# Patient Record
Sex: Male | Born: 1990
Health system: Southern US, Community
[De-identification: ages and names within clinical notes are randomized; demographics above are authoritative.]

---
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
## 2010-11-01 ENCOUNTER — Encounter: Payer: Self-pay | Admitting: *Deleted

## 2010-11-01 ENCOUNTER — Emergency Department (HOSPITAL_COMMUNITY): Payer: No Typology Code available for payment source

## 2010-11-01 ENCOUNTER — Emergency Department (HOSPITAL_COMMUNITY)
Admission: EM | Admit: 2010-11-01 | Discharge: 2010-11-02 | Disposition: A | Discharge status: Home / Self Care | Payer: No Typology Code available for payment source | Attending: Emergency Medicine | Admitting: Emergency Medicine

## 2010-11-01 DIAGNOSIS — S20219A Contusion of unspecified front wall of thorax, initial encounter: Secondary | ICD-10-CM

## 2010-11-01 DIAGNOSIS — S239XXA Sprain of unspecified parts of thorax, initial encounter: Secondary | ICD-10-CM | POA: Insufficient documentation

## 2010-11-01 DIAGNOSIS — IMO0002 Reserved for concepts with insufficient information to code with codable children: Secondary | ICD-10-CM

## 2010-11-01 DIAGNOSIS — Y92410 Unspecified street and highway as the place of occurrence of the external cause: Secondary | ICD-10-CM | POA: Insufficient documentation

## 2010-11-01 MED ORDER — IBUPROFEN 800 MG PO TABS
800.0000 mg | ORAL_TABLET | Freq: Once | ORAL | Status: AC
  Administered 2010-11-01: 800 mg via ORAL
  Filled 2010-11-01: qty 1
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
## 2010-11-01 NOTE — ED Notes (Signed)
Spoke w/ pa about seeing pt to get him off the back board.
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
## 2010-11-02 MED ORDER — IBUPROFEN 800 MG PO TABS: 800.0000 mg | ORAL_TABLET | Freq: Once | ORAL | Status: DC
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
## 2010-12-05 NOTE — ED Provider Notes (Signed)
Medical screening examination/treatment/procedure(s) were performed by non-physician practitioner and as supervising physician I was immediately available for consultation/collaboration.  Nicoletta Dress. Colon Branch, MD 12/05/10 [phone]

## 2010-12-12 LAB — DIFFERENTIAL
Basophils Absolute: 0.1
Eosinophils Absolute: 0.2
Lymphocytes Relative: 31
Lymphs Abs: 2.1
Neutrophils Relative %: 54

## 2010-12-12 LAB — CBC
HCT: 41
Hemoglobin: 13.9
MCHC: 34
MCV: 83.1
RBC: 4.94

## 2010-12-12 LAB — COMPREHENSIVE METABOLIC PANEL
BUN: 14
CO2: 28
Calcium: 9.1
Creatinine, Ser: 0.91
Glucose, Bld: 103 — ABNORMAL HIGH
Total Bilirubin: 0.3

## 2010-12-12 LAB — URINALYSIS, ROUTINE W REFLEX MICROSCOPIC
Ketones, ur: NEGATIVE
Nitrite: NEGATIVE
Specific Gravity, Urine: 1.015
Urobilinogen, UA: 0.2
pH: 7

## 2010-12-12 LAB — LIPASE, BLOOD: Lipase: 15

## 2012-10-14 ENCOUNTER — Encounter (HOSPITAL_COMMUNITY): Payer: Self-pay | Admitting: Emergency Medicine

## 2012-10-14 ENCOUNTER — Emergency Department (HOSPITAL_COMMUNITY)
Admission: EM | Admit: 2012-10-14 | Discharge: 2012-10-14 | Disposition: A | Discharge status: Home / Self Care | Payer: BC Managed Care – PPO | Attending: Emergency Medicine | Admitting: Emergency Medicine

## 2012-10-14 DIAGNOSIS — M7989 Other specified soft tissue disorders: Secondary | ICD-10-CM | POA: Insufficient documentation

## 2012-10-14 DIAGNOSIS — R209 Unspecified disturbances of skin sensation: Secondary | ICD-10-CM | POA: Insufficient documentation

## 2012-10-14 DIAGNOSIS — Y929 Unspecified place or not applicable: Secondary | ICD-10-CM | POA: Insufficient documentation

## 2012-10-14 DIAGNOSIS — Y939 Activity, unspecified: Secondary | ICD-10-CM | POA: Insufficient documentation

## 2012-10-14 DIAGNOSIS — T6391XA Toxic effect of contact with unspecified venomous animal, accidental (unintentional), initial encounter: Secondary | ICD-10-CM | POA: Insufficient documentation

## 2012-10-14 DIAGNOSIS — T63461A Toxic effect of venom of wasps, accidental (unintentional), initial encounter: Secondary | ICD-10-CM | POA: Insufficient documentation

## 2012-10-14 MED ORDER — DIPHENHYDRAMINE HCL 25 MG PO CAPS
25.0000 mg | ORAL_CAPSULE | Freq: Once | ORAL | Status: AC
  Administered 2012-10-14: 25 mg via ORAL
  Filled 2012-10-14: qty 1

## 2012-10-14 MED ORDER — PREDNISONE 10 MG PO TABS: ORAL_TABLET | ORAL | Status: DC

## 2012-10-14 MED ORDER — PREDNISONE 50 MG PO TABS
60.0000 mg | ORAL_TABLET | Freq: Once | ORAL | Status: AC
  Administered 2012-10-14: 60 mg via ORAL
  Filled 2012-10-14: qty 1

## 2012-10-14 MED ORDER — FAMOTIDINE 20 MG PO TABS
20.0000 mg | ORAL_TABLET | Freq: Once | ORAL | Status: AC
  Administered 2012-10-14: 20 mg via ORAL
  Filled 2012-10-14: qty 1

## 2012-10-14 MED ORDER — FEXOFENADINE HCL 180 MG PO TABS: 180.0000 mg | ORAL_TABLET | Freq: Every day | ORAL | Status: DC

## 2012-10-14 NOTE — ED Notes (Signed)
Pt states he grabbed a railing and was either stung or biten on his L hand. Edema and redness noted to fingers and hand. NAD noted.

## 2012-10-14 NOTE — ED Notes (Signed)
BP retaken with larger cuff. 131/82.

## 2012-10-14 NOTE — ED Provider Notes (Signed)
CSN: [id number]     Arrival date & time 10/14/12  2127 History     First MD Initiated Contact with Patient 10/14/12 2138     Chief Complaint  Patient presents with  . Insect Bite   (Consider location/radiation/quality/duration/timing/severity/associated sxs/prior Treatment) HPI Comments: Patient is a 22 year old male who states that earlier this evening he grabbed a hole of a railing and noticed a stinging sensation of his left hand. Shortly after that he noted swelling about the apical areas extending back into the back of his hand. The patient denies any shortness of breath, wheezing, difficulty with breathing, or loss of consciousness. Patient states to his knowledge he does not have an allergy to be your walls stains. He is not sure if he was stung, or if he was bitten by something. Patient presents at this time for evaluation, as he states he has not had this type of reaction to a bite or steam in the past.  The history is provided by the patient.    History reviewed. No pertinent past medical history. History reviewed. No pertinent past surgical history. Family History  Problem Relation Age of Onset  . Asthma Mother   . Asthma Brother   . Heart failure Neg Hx    History  Substance Use Topics  . Smoking status: Never Smoker   . Smokeless tobacco: Never Used  . Alcohol Use: No    Review of Systems  Constitutional: Negative for activity change.       All ROS Neg except as noted in HPI  HENT: Negative for nosebleeds and neck pain.   Eyes: Negative for photophobia and discharge.  Respiratory: Negative for cough, shortness of breath and wheezing.   Cardiovascular: Negative for chest pain and palpitations.  Gastrointestinal: Negative for abdominal pain and blood in stool.  Genitourinary: Negative for dysuria, frequency and hematuria.  Musculoskeletal: Negative for back pain and arthralgias.  Skin: Negative.   Neurological: Negative for dizziness, seizures and speech  difficulty.  Psychiatric/Behavioral: Negative for hallucinations and confusion.    Allergies  Review of patient's allergies indicates no known allergies.  Home Medications   Current Outpatient Rx  Name  Route  Sig  Dispense  Refill  . fexofenadine (ALLEGRA) 180 MG tablet   Oral   Take 1 tablet (180 mg total) by mouth daily.   15 tablet   0   . predniSONE (DELTASONE) 10 MG tablet      5,4,3,2,1 - take with food   15 tablet   0    BP 131/82  Pulse 76  Temp(Src) 98.4 F (36.9 C) (Oral)  Ht 6\' 3"  (1.905 m)  Wt 230 lb (104.327 kg)  BMI 28.75 kg/m2  SpO2 99% Physical Exam  Nursing note and vitals reviewed. Constitutional: He is oriented to person, place, and time. He appears well-developed and well-nourished.  Non-toxic appearance.  HENT:  Head: Normocephalic.  Right Ear: Tympanic membrane and external ear normal.  Left Ear: Tympanic membrane and external ear normal.  No facial or oral swelling appreciated. Airway is patent. Speech is clear and understandable.  Eyes: EOM and lids are normal. Pupils are equal, round, and reactive to light.  Neck: Normal range of motion. Neck supple. Carotid bruit is not present.  Cardiovascular: Normal rate, regular rhythm, normal heart sounds, intact distal pulses and normal pulses.   Pulmonary/Chest: Breath sounds normal. No respiratory distress. He has no wheezes. He has no rales. He exhibits no tenderness.  Abdominal: Soft. Bowel sounds are normal.  There is no tenderness. There is no guarding.  Musculoskeletal: Normal range of motion.  Patient has mild-to-moderate swelling of the MP joints 2, 3 and 4 of the left hand. He has good range of motion of the fingers, but with some stiffness. There is full range of motion of the left wrist. Capillary refill is less than 2 seconds.  Lymphadenopathy:       Head (right side): No submandibular adenopathy present.       Head (left side): No submandibular adenopathy present.    He has no cervical  adenopathy.  Neurological: He is alert and oriented to person, place, and time. He has normal strength. No cranial nerve deficit or sensory deficit.  Skin: Skin is warm and dry.  Psychiatric: He has a normal mood and affect. His speech is normal.    ED Course   Procedures (including critical care time)  Labs Reviewed - No data to display No results found. 1. Insect sting, initial encounter     MDM  *I have reviewed nursing notes, vital signs, and all appropriate lab and imaging results for this patient.** Patient states he sustained a stain or a bite to his left hand earlier this evening. He noticed increasing swelling and redness and came to the emergency department because this is a different reaction and he has had in the past 2 bee stings or insect bites.  Patient was treated with prednisone, Benadryl, Pepcid, and ice. He was observed in the emergency department with no deterioration in his condition. The patient is noted to traverse with his friend in the room. He is ambulatory in the  [place] without problem. He speaks in complete sentences.  Patient advised to use ice to his hand tonight and tomorrow. He is to use Allegra 1 each morning, he can use Benadryl at bedtime if needed for itching or burning sensation. He is getting a tapering dose of prednisone. He is advised to return to the emergency department immediately if any emergent changes, problems, or concerns.  Kathie Dike, PA-C 10/14/12 2243

## 2012-10-15 NOTE — ED Provider Notes (Signed)
Medical screening examination/treatment/procedure(s) were performed by non-physician practitioner and as supervising physician I was immediately available for consultation/collaboration. Devoria Albe, MD, Armando Gang   Ward Givens, MD 10/15/12 [phone]

## 2013-11-19 ENCOUNTER — Encounter (HOSPITAL_COMMUNITY): Payer: Self-pay | Admitting: Emergency Medicine

## 2013-11-19 ENCOUNTER — Emergency Department (HOSPITAL_COMMUNITY): Payer: BC Managed Care – PPO

## 2013-11-19 ENCOUNTER — Emergency Department (HOSPITAL_COMMUNITY)
Admission: EM | Admit: 2013-11-19 | Discharge: 2013-11-19 | Disposition: A | Discharge status: Home / Self Care | Payer: BC Managed Care – PPO | Attending: Emergency Medicine | Admitting: Emergency Medicine

## 2013-11-19 DIAGNOSIS — S20211A Contusion of right front wall of thorax, initial encounter: Secondary | ICD-10-CM

## 2013-11-19 DIAGNOSIS — S39011A Strain of muscle, fascia and tendon of abdomen, initial encounter: Secondary | ICD-10-CM

## 2013-11-19 DIAGNOSIS — X58XXXA Exposure to other specified factors, initial encounter: Secondary | ICD-10-CM | POA: Insufficient documentation

## 2013-11-19 DIAGNOSIS — IMO0002 Reserved for concepts with insufficient information to code with codable children: Secondary | ICD-10-CM | POA: Insufficient documentation

## 2013-11-19 DIAGNOSIS — S298XXA Other specified injuries of thorax, initial encounter: Secondary | ICD-10-CM | POA: Diagnosis present

## 2013-11-19 DIAGNOSIS — Y93B9 Activity, other involving muscle strengthening exercises: Secondary | ICD-10-CM | POA: Diagnosis not present

## 2013-11-19 DIAGNOSIS — Y929 Unspecified place or not applicable: Secondary | ICD-10-CM | POA: Diagnosis not present

## 2013-11-19 DIAGNOSIS — S20219A Contusion of unspecified front wall of thorax, initial encounter: Secondary | ICD-10-CM | POA: Diagnosis not present

## 2013-11-19 MED ORDER — MELOXICAM 15 MG PO TABS
15.0000 mg | ORAL_TABLET | Freq: Every day | ORAL | Status: DC
Start: 2013-11-19 — End: 2019-01-12

## 2013-11-19 MED ORDER — METHOCARBAMOL 500 MG PO TABS: 500.0000 mg | ORAL_TABLET | Freq: Three times a day (TID) | ORAL | Status: DC

## 2013-11-19 MED ORDER — HYDROCODONE-ACETAMINOPHEN 5-325 MG PO TABS: 1.0000 | ORAL_TABLET | ORAL | Status: DC | PRN

## 2013-11-19 NOTE — ED Provider Notes (Signed)
Medical screening examination/treatment/procedure(s) were performed by non-physician practitioner and as supervising physician I was immediately available for consultation/collaboration.   EKG Interpretation None        Enid Skeens, MD 11/19/13 [phone]

## 2013-11-19 NOTE — ED Provider Notes (Signed)
CSN: [id number]     Arrival date & time 11/19/13  1308 History   First MD Initiated Contact with Patient 11/19/13 1406     No chief complaint on file.    (Consider location/radiation/quality/duration/timing/severity/associated sxs/prior Treatment) HPI Comments: Patient is a 23 year old male who presents to the emergency department with right lower rib area pain. The patient states that he was exercising earlier when he sustained an injury to the right lower ribs. He states after this injury he was noticing increasing pain with motion, as well as pain with deep breathing. He denies any hemoptysis. He denies unusual shortness of breath. The patient denies being on any anticoagulation medications and he has no history of bleeding disorders. His been no previous operations or procedures involving the lung area.  The history is provided by the patient.    History reviewed. No pertinent past medical history. History reviewed. No pertinent past surgical history. Family History  Problem Relation Age of Onset  . Asthma Mother   . Asthma Brother   . Heart failure Neg Hx    History  Substance Use Topics  . Smoking status: Never Smoker   . Smokeless tobacco: Never Used  . Alcohol Use: No    Review of Systems  Constitutional: Negative for activity change.       All ROS Neg except as noted in HPI  HENT: Negative for nosebleeds.   Eyes: Negative for photophobia and discharge.  Respiratory: Negative for cough, shortness of breath and wheezing.   Cardiovascular: Negative for chest pain and palpitations.  Gastrointestinal: Negative for abdominal pain and blood in stool.  Genitourinary: Negative for dysuria, frequency and hematuria.  Musculoskeletal: Negative for arthralgias, back pain and neck pain.  Skin: Negative.   Neurological: Negative for dizziness, seizures and speech difficulty.  Psychiatric/Behavioral: Negative for hallucinations and confusion.      Allergies  Review of patient's  allergies indicates no known allergies.  Home Medications   Prior to Admission medications   Not on File   BP 133/74  Pulse 64  Temp(Src) 99.1 F (37.3 C) (Oral)  Resp 18  Ht  (1.905 m)  Wt 247 lb (112.038 kg)  BMI 30.87 kg/m2  SpO2 99% Physical Exam  Nursing note and vitals reviewed. Constitutional: He is oriented to person, place, and time. He appears well-developed and well-nourished.  Non-toxic appearance.  HENT:  Head: Normocephalic.  Right Ear: Tympanic membrane and external ear normal.  Left Ear: Tympanic membrane and external ear normal.  Eyes: EOM and lids are normal. Pupils are equal, round, and reactive to light.  Neck: Normal range of motion. Neck supple. Carotid bruit is not present.  Cardiovascular: Normal rate, regular rhythm, normal heart sounds, intact distal pulses and normal pulses.   Pulmonary/Chest: Breath sounds normal. No respiratory distress. He exhibits tenderness and bony tenderness. He exhibits no crepitus.    Abdominal: Soft. Bowel sounds are normal. There is no tenderness. There is no guarding.  Musculoskeletal: Normal range of motion.  Lymphadenopathy:       Head (right side): No submandibular adenopathy present.       Head (left side): No submandibular adenopathy present.    He has no cervical adenopathy.  Neurological: He is alert and oriented to person, place, and time. He has normal strength. No cranial nerve deficit or sensory deficit.  Skin: Skin is warm and dry.  Psychiatric: He has a normal mood and affect. His speech is normal.    ED Course  Procedures (including  critical care time) Labs Review Labs Reviewed - No data to display  Imaging Review Dg Ribs Unilateral W/chest Right  11/19/2013   CLINICAL DATA:  Right low anterior rib pain exacerbated by motion and inspiration.  EXAM: RIGHT RIBS AND CHEST - 3+ VIEW  COMPARISON:  Prior chest x-ray 11/01/2010  FINDINGS: No fracture or other bone lesions are seen involving the ribs.  There is no evidence of pneumothorax or pleural effusion. Both lungs are clear. Heart size and mediastinal contours are within normal limits.  IMPRESSION: Negative.   Electronically Signed   By: Malachy Moan M.D.   On: 11/19/2013 14:20     EKG Interpretation None      MDM  Patient speaks in complete sentences without problem. Pulse oximetry is within normal limits. Chest x-ray is negative for any cardiopulmonary related problem. X-ray of the ribs negative for fracture or dislocation.  Patient treated with incentive spirometer, prescription given for Mobic, Robaxin, and Norco. The patient is advised to return to the emergency department if any changes, problems, or concerns.    Final diagnoses:  None    **I have reviewed nursing notes, vital signs, and all appropriate lab and imaging results for this patient.Kathie Dike, PA-C 11/19/13 1451

## 2013-11-19 NOTE — Discharge Instructions (Signed)
Your oxygen level is within normal limits. Your chest x-ray is negative for any acute problem. Your rib x-rays are negative for fracture or dislocation. Please use the incentive spirometer every 2 hours. Please use Mobic daily with food. Use Robaxin 3 times daily for spasm pain. Use Norco for pain if needed. Norco and Robaxin may cause drowsiness, please use with caution. Muscle Strain A muscle strain is an injury that occurs when a muscle is stretched beyond its normal length. Usually a small number of muscle fibers are torn when this happens. Muscle strain is rated in degrees. First-degree strains have the least amount of muscle fiber tearing and pain. Second-degree and third-degree strains have increasingly more tearing and pain.  Usually, recovery from muscle strain takes 1-2 weeks. Complete healing takes 5-6 weeks.  CAUSES  Muscle strain happens when a sudden, violent force placed on a muscle stretches it too far. This may occur with lifting, sports, or a fall.  RISK FACTORS Muscle strain is especially common in athletes.  SIGNS AND SYMPTOMS At the site of the muscle strain, there may be:  Pain.  Bruising.  Swelling.  Difficulty using the muscle due to pain or lack of normal function. DIAGNOSIS  Your health care provider will perform a physical exam and ask about your medical history. TREATMENT  Often, the best treatment for a muscle strain is resting, icing, and applying cold compresses to the injured area.  HOME CARE INSTRUCTIONS   Use the PRICE method of treatment to promote muscle healing during the first 2-3 days after your injury. The PRICE method involves:  Protecting the muscle from being injured again.  Restricting your activity and resting the injured body part.  Icing your injury. To do this, put ice in a plastic bag. Place a towel between your skin and the bag. Then, apply the ice and leave it on from 15-20 minutes each hour. After the third day, switch to moist heat  packs.  Apply compression to the injured area with a splint or elastic bandage. Be careful not to wrap it too tightly. This may interfere with blood circulation or increase swelling.  Elevate the injured body part above the level of your heart as often as you can.  Only take over-the-counter or prescription medicines for pain, discomfort, or fever as directed by your health care provider.  Warming up prior to exercise helps to prevent future muscle strains. SEEK MEDICAL CARE IF:   You have increasing pain or swelling in the injured area.  You have numbness, tingling, or a significant loss of strength in the injured area. MAKE SURE YOU:   Understand these instructions.  Will watch your condition.  Will get help right away if you are not doing well or get worse. Document Released: 02/19/2005 Document Revised: 12/10/2012 Document Reviewed: 09/18/2012 [facility] Patient Information 2015 [place], [place]. This information is not intended to replace advice given to you by your health care provider. Make sure you discuss any questions you have with your health care provider.  Contusion A contusion is a deep bruise. Contusions happen when an injury causes bleeding under the skin. Signs of bruising include pain, puffiness (swelling), and discolored skin. The contusion may turn blue, purple, or yellow. HOME CARE   Put ice on the injured area.  Put ice in a plastic bag.  Place a towel between your skin and the bag.  Leave the ice on for 15-20 minutes, 03-04 times a day.  Only take medicine as told by your  doctor.  Rest the injured area.  If possible, raise (elevate) the injured area to lessen puffiness. GET HELP RIGHT AWAY IF:   You have more bruising or puffiness.  You have pain that is getting worse.  Your puffiness or pain is not helped by medicine. MAKE SURE YOU:   Understand these instructions.  Will watch your condition.  Will get help right away if you are not doing  well or get worse. Document Released: 08/08/2007 Document Revised: 05/14/2011 Document Reviewed: 12/25/2010 [facility] Patient Information 2015 [place], [place]. This information is not intended to replace advice given to you by your health care provider. Make sure you discuss any questions you have with your health care provider. And a

## 2013-11-19 NOTE — ED Notes (Signed)
Pt was exercising and had sudden onset of pain rt lower ant ribs, Increased pain with motion and deep breath.

## 2016-03-05 IMAGING — CR DG RIBS W/ CHEST 3+V*R*
5 series · 5 of 5 positions shown · non-contrast
Comparison: Prior chest x-ray 11/01/2010

CLINICAL DATA: Right low anterior rib pain exacerbated by motion
and inspiration.

EXAM:
RIGHT RIBS AND CHEST - 3+ VIEW

[view not recorded (1 of 5)]
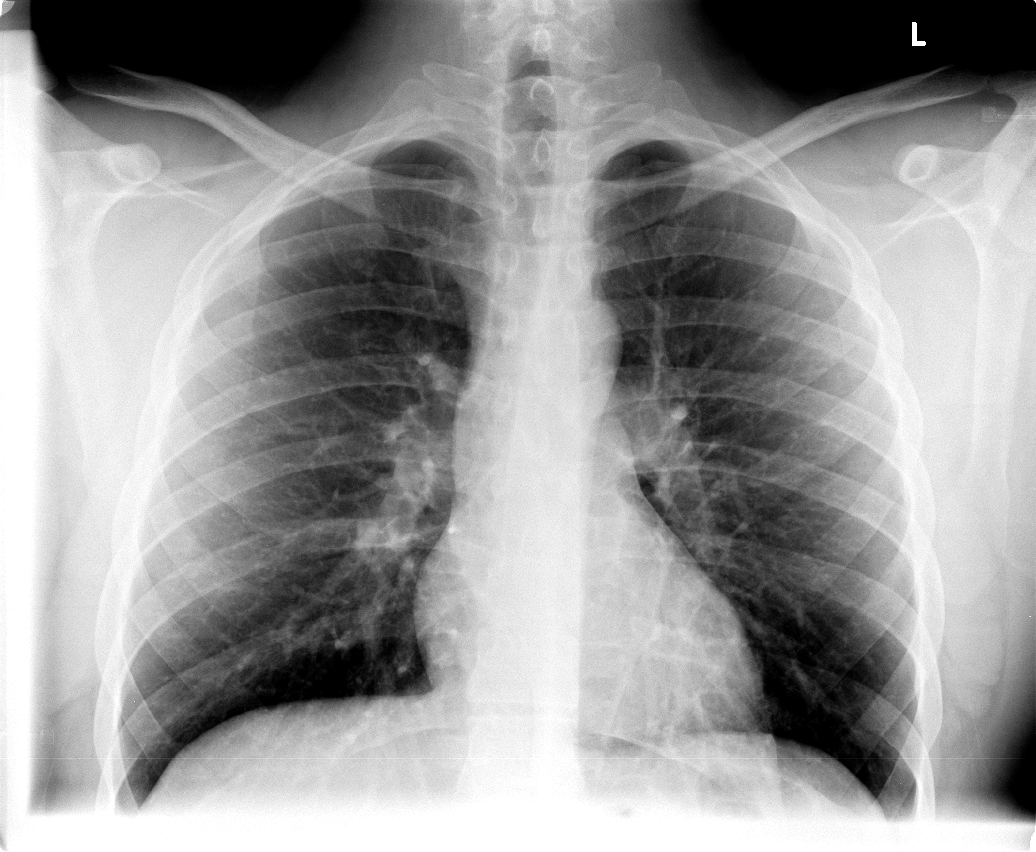

[view not recorded (2 of 5)]
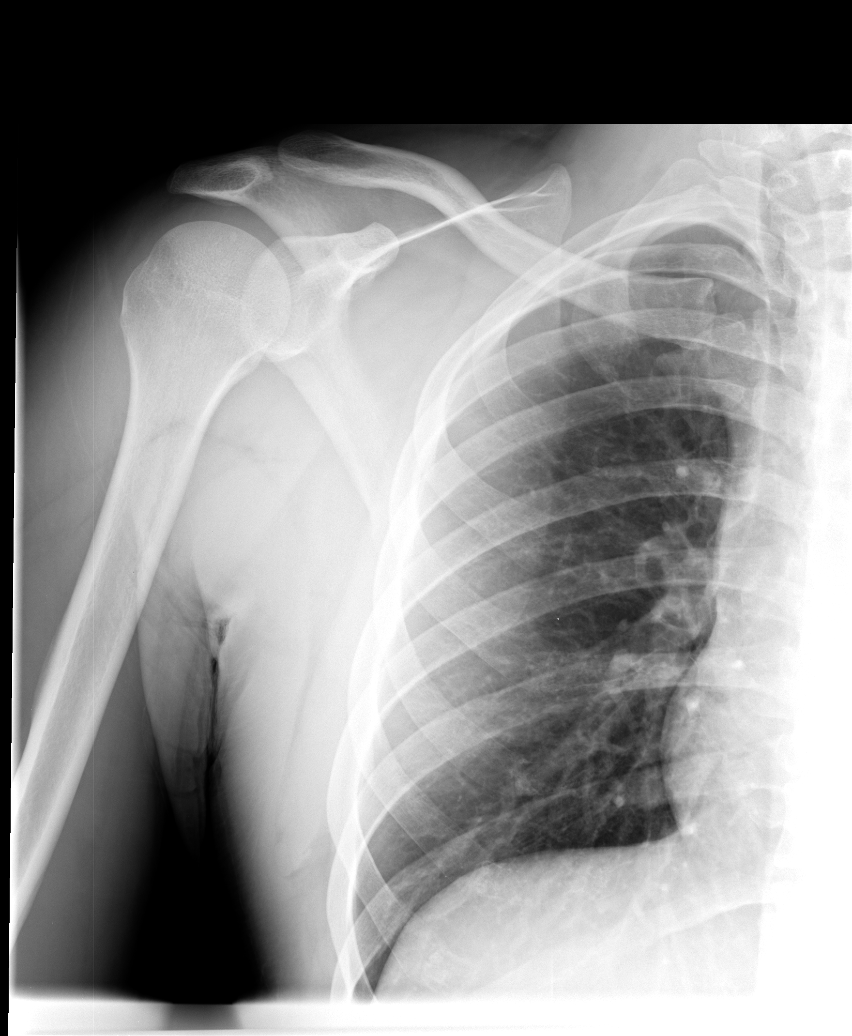

[view not recorded (3 of 5)]
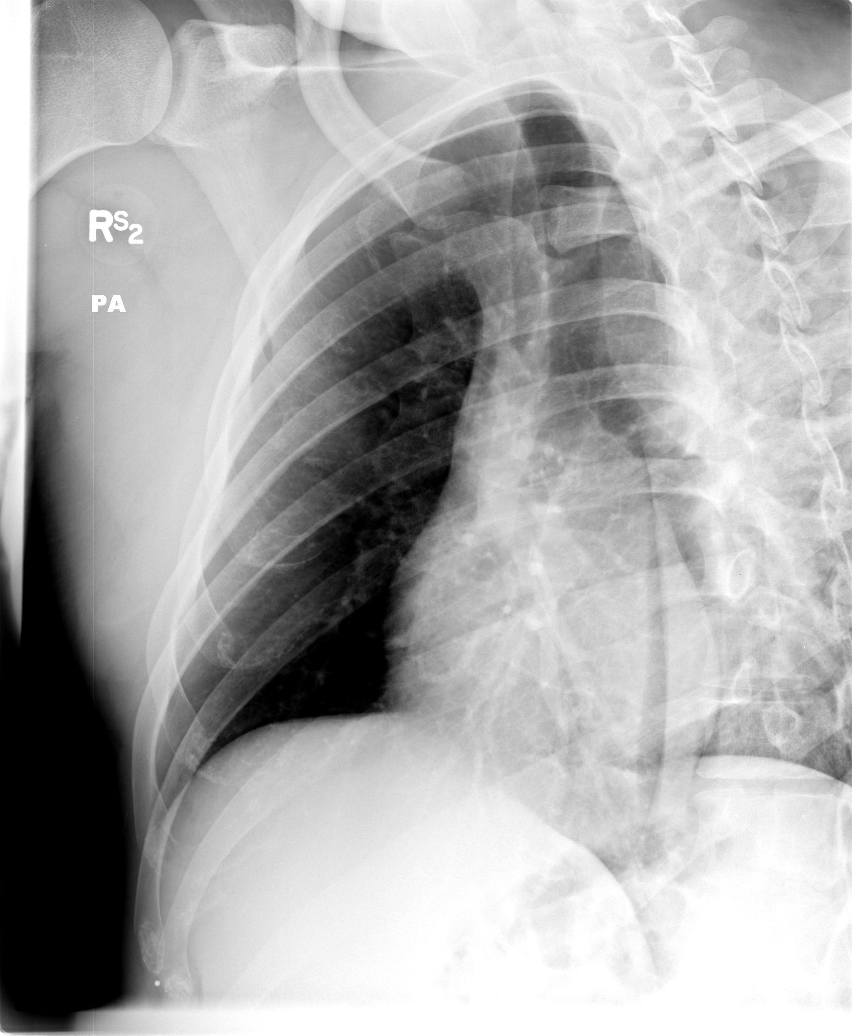

[view not recorded (4 of 5)]
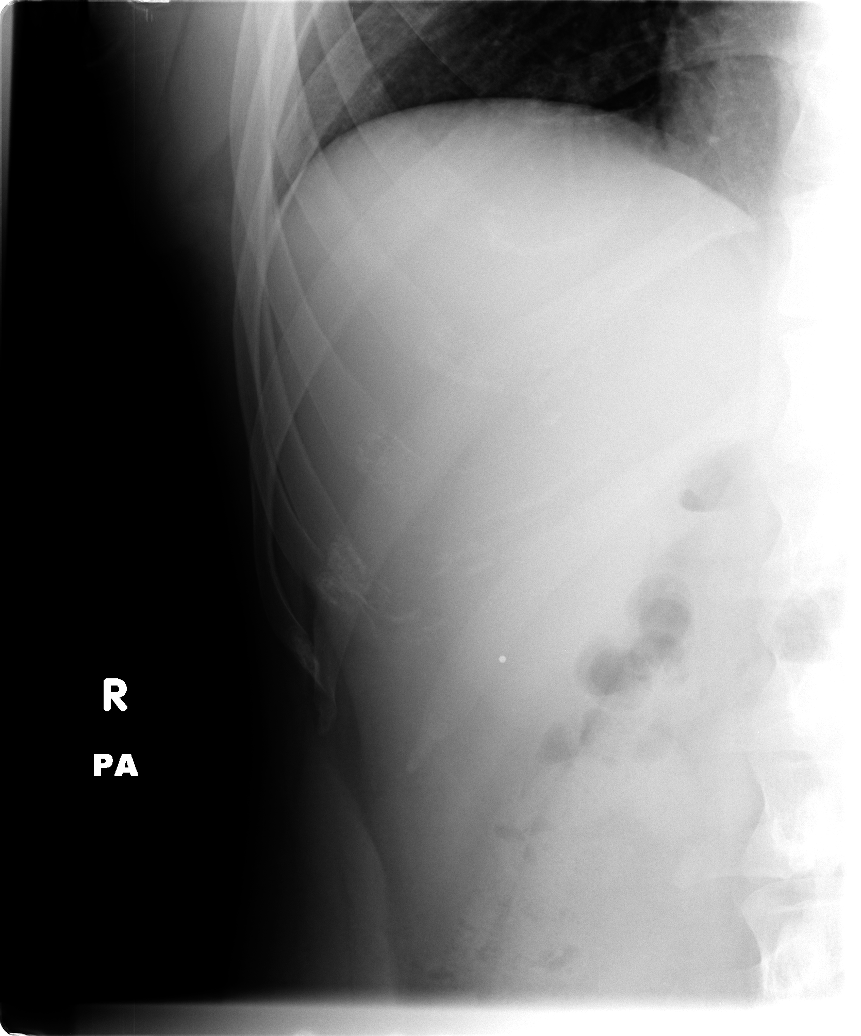

[view not recorded (5 of 5)]
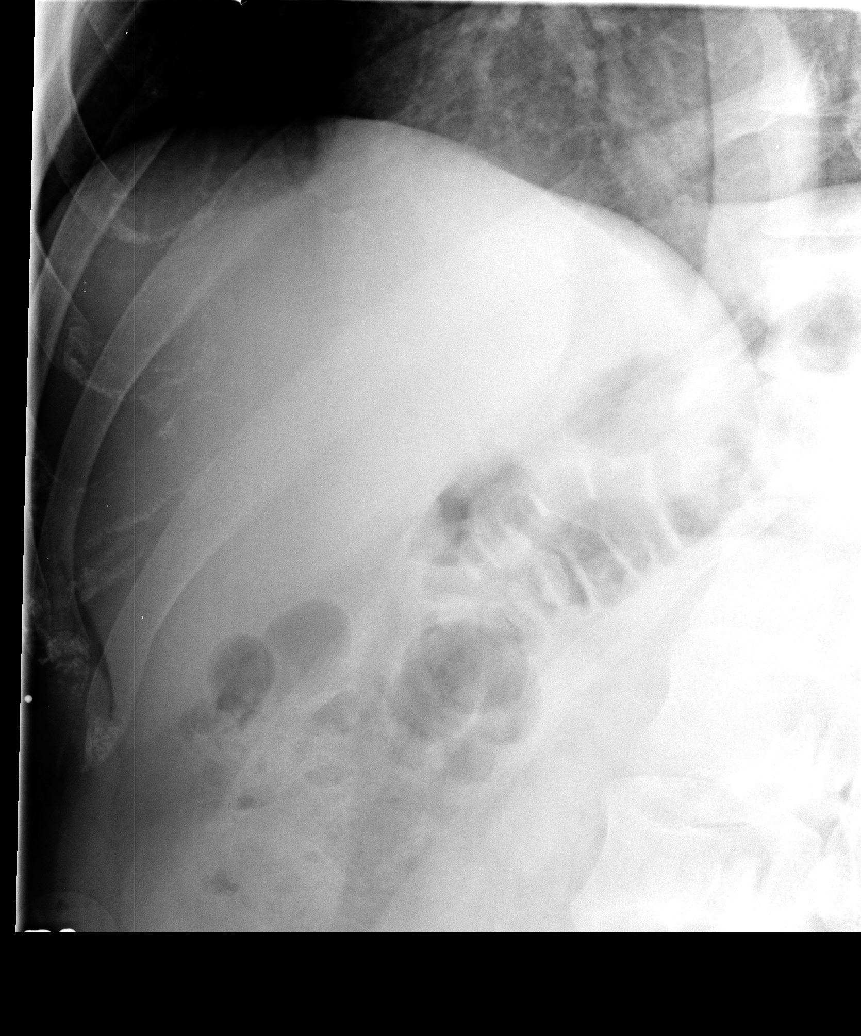

[5 of 5 positions shown; findings below may reference images not displayed]

FINDINGS: No fracture or other bone lesions are seen involving the ribs. There
is no evidence of pneumothorax or pleural effusion. Both lungs are
clear. Heart size and mediastinal contours are within normal limits.
IMPRESSION: Negative.

## 2018-04-15 DIAGNOSIS — Z13220 Encounter for screening for lipoid disorders: Secondary | ICD-10-CM | POA: Diagnosis not present

## 2018-04-15 DIAGNOSIS — E559 Vitamin D deficiency, unspecified: Secondary | ICD-10-CM | POA: Diagnosis not present

## 2018-04-15 DIAGNOSIS — R5383 Other fatigue: Secondary | ICD-10-CM | POA: Diagnosis not present

## 2018-04-15 DIAGNOSIS — Z131 Encounter for screening for diabetes mellitus: Secondary | ICD-10-CM | POA: Diagnosis not present

## 2018-04-15 DIAGNOSIS — Z0000 Encounter for general adult medical examination without abnormal findings: Secondary | ICD-10-CM | POA: Diagnosis not present

## 2018-04-29 DIAGNOSIS — E669 Obesity, unspecified: Secondary | ICD-10-CM | POA: Diagnosis not present

## 2018-04-29 DIAGNOSIS — R030 Elevated blood-pressure reading, without diagnosis of hypertension: Secondary | ICD-10-CM | POA: Diagnosis not present

## 2018-04-29 DIAGNOSIS — E559 Vitamin D deficiency, unspecified: Secondary | ICD-10-CM | POA: Diagnosis not present

## 2018-12-08 ENCOUNTER — Other Ambulatory Visit (INDEPENDENT_AMBULATORY_CARE_PROVIDER_SITE_OTHER): Payer: Self-pay

## 2018-12-08 ENCOUNTER — Encounter (INDEPENDENT_AMBULATORY_CARE_PROVIDER_SITE_OTHER): Payer: Self-pay | Admitting: Internal Medicine

## 2018-12-08 ENCOUNTER — Other Ambulatory Visit (INDEPENDENT_AMBULATORY_CARE_PROVIDER_SITE_OTHER): Payer: Self-pay | Admitting: Internal Medicine

## 2018-12-08 ENCOUNTER — Telehealth (INDEPENDENT_AMBULATORY_CARE_PROVIDER_SITE_OTHER): Payer: Self-pay

## 2018-12-08 DIAGNOSIS — Z03818 Encounter for observation for suspected exposure to other biological agents ruled out: Secondary | ICD-10-CM

## 2018-12-08 DIAGNOSIS — Z20822 Contact with and (suspected) exposure to covid-19: Secondary | ICD-10-CM

## 2018-12-08 NOTE — Telephone Encounter (Signed)
Let patient know that I have the note ready but he also needs to make a follow-up appointment to be seen in 2 to 3 weeks time as we have not seen him for a long time.

## 2018-12-08 NOTE — Progress Notes (Unsigned)
Pt will need a order to be tested today for covid in [place].

## 2018-12-08 NOTE — Progress Notes (Signed)
Already sent an order

## 2018-12-08 NOTE — Telephone Encounter (Signed)
Appt schedule on 2nd week of Nov.

## 2018-12-09 ENCOUNTER — Other Ambulatory Visit: Payer: Self-pay | Admitting: *Deleted

## 2018-12-09 DIAGNOSIS — Z20822 Contact with and (suspected) exposure to covid-19: Secondary | ICD-10-CM

## 2018-12-09 DIAGNOSIS — Z20828 Contact with and (suspected) exposure to other viral communicable diseases: Secondary | ICD-10-CM | POA: Diagnosis not present

## 2018-12-11 ENCOUNTER — Telehealth (INDEPENDENT_AMBULATORY_CARE_PROVIDER_SITE_OTHER): Payer: Self-pay | Admitting: Internal Medicine

## 2018-12-11 NOTE — Telephone Encounter (Signed)
Let the patient know that the testing center should call him before I would receive the results in most cases.

## 2018-12-11 NOTE — Telephone Encounter (Signed)
Pt wanted to know who will contact for  COVID test?

## 2018-12-12 LAB — NOVEL CORONAVIRUS, NAA: SARS-CoV-2, NAA: NOT DETECTED

## 2019-01-12 ENCOUNTER — Ambulatory Visit (INDEPENDENT_AMBULATORY_CARE_PROVIDER_SITE_OTHER): Payer: BC Managed Care – PPO | Admitting: Internal Medicine

## 2019-01-12 ENCOUNTER — Other Ambulatory Visit: Payer: Self-pay

## 2019-01-12 ENCOUNTER — Encounter (INDEPENDENT_AMBULATORY_CARE_PROVIDER_SITE_OTHER): Payer: Self-pay | Admitting: Internal Medicine

## 2019-01-12 VITALS — BP 130/80 | HR 88 | Temp 98.6°F | Resp 18 | Ht 74.0 in | Wt 254.0 lb

## 2019-01-12 DIAGNOSIS — E559 Vitamin D deficiency, unspecified: Secondary | ICD-10-CM | POA: Diagnosis not present

## 2019-01-12 DIAGNOSIS — E669 Obesity, unspecified: Secondary | ICD-10-CM | POA: Diagnosis not present

## 2019-01-12 DIAGNOSIS — E66811 Obesity, class 1: Secondary | ICD-10-CM

## 2019-01-12 DIAGNOSIS — Z8639 Personal history of other endocrine, nutritional and metabolic disease: Secondary | ICD-10-CM | POA: Insufficient documentation

## 2019-01-12 HISTORY — DX: Vitamin D deficiency, unspecified: E55.9

## 2019-01-12 NOTE — Progress Notes (Signed)
Metrics: Intervention Frequency ACO  Documented Smoking Status Yearly  Screened one or more times in 24 months  Cessation Counseling or  Active cessation medication Past 24 months  Past 24 months   Guideline developer: UpToDate (See UpToDate for funding source) Date Released: 2014       Wellness Office Visit  Subjective:  Patient ID: Zachary Strong, male    DOB: 1990-06-21  Age: 28 y.o. MRN: [id number]  CC: This man comes in for follow-up of vitamin D deficiency and obesity. HPI  When he was seen the last time which was more than 8 months ago, he was recommended to take vitamin D3 10,000 units daily and was given advice on nutrition.  Unfortunately, he has not done either of these things so far. Past Medical History:  Diagnosis Date  . Vitamin D deficiency disease 01/12/2019      Family History  Problem Relation Age of Onset  . Asthma Mother   . Asthma Brother   . Heart failure Neg Hx     Social History   Social History Narrative   Engaged,getting married in December..Lives with fiancee.Works for [employer].   Social History   Tobacco Use  . Smoking status: Never Smoker  . Smokeless tobacco: Never Used  Substance Use Topics  . Alcohol use: No    No outpatient medications have been marked as taking for the 01/12/19 encounter (Office Visit) with Doree Albee, MD.     Objective:   Today's Vitals: BP 130/80 (BP Location: Left Arm, Patient Position: Sitting, Cuff Size: Normal)   Pulse 88   Temp 98.6 F (37 C)   Resp 18   Ht 6\' 2"  (1.88 m)   Wt 254 lb (115.2 kg)   SpO2 98%   BMI 32.61 kg/m  Vitals with BMI 01/12/2019 11/19/2013 10/14/2012  Height 6\' 2"  6\' 3"  -  Weight 254 lbs 247 lbs -  BMI 40.9 81.1 -  Systolic 914 782 956  Diastolic 80 74 82  Pulse 88 64 -     Physical Exam       Assessment   1. Obesity (BMI 30.0-34.9)   2. Vitamin D deficiency disease       Tests ordered No orders of the defined types were placed  in this encounter.    Plan: 1. I discussed with him the concept of intermittent fasting and eating healthier again and hopefully he will try to do this.  I have given him a diet sheet also. 2. I strongly encouraged him to take vitamin D3 10,000 units daily. 3. I will see him in February of next year for an annual physical exam when we will do all the blood work.   No orders of the defined types were placed in this encounter.   Doree Albee, MD

## 2019-01-12 NOTE — Patient Instructions (Signed)
[facility] Dietary Recommendations for Weight Loss What to Avoid . Avoid added sugars o Often added sugar can be found in processed foods such as many condiments, dry cereals, cakes, cookies, chips, crisps, crackers, candies, sweetened drinks, etc.  o Read labels and AVOID/DECREASE use of foods with the following in their ingredient list: Sugar, fructose, high fructose corn syrup, sucrose, glucose, maltose, dextrose, molasses, cane sugar, brown sugar, any type of syrup, agave nectar, etc.   . Avoid snacking in between meals . Avoid foods made with flour o If you are going to eat food made with flour, choose those made with whole-grains; and, minimize your consumption as much as is tolerable . Avoid processed foods o These foods are generally stocked in the middle of the grocery store. Focus on shopping on the perimeter of the grocery.  What to Include . Vegetables o GREEN LEAFY VEGETABLES: Kale, spinach, mustard greens, collard greens, cabbage, broccoli, etc. o OTHER: Asparagus, cauliflower, eggplant, carrots, peas, Brussel sprouts, tomatoes, bell peppers, zucchini, beets, cucumbers, etc. . Grains, seeds, and legumes o Beans: kidney beans, black eyed peas, garbanzo beans, black beans, pinto beans, etc. o Whole, unrefined grains: brown rice, barley, bulgur, oatmeal, etc. . Healthy fats  o Avoid highly processed fats such as vegetable oil o Examples of healthy fats: avocado, olives, virgin olive oil, dark chocolate (?72% Cocoa), nuts (peanuts, almonds, walnuts, cashews, pecans, etc.) . Low - Moderate Intake of Animal Sources of Protein o Meat sources: chicken, turkey, salmon, tuna. Limit to 4 ounces of meat at one time. o Consider limiting dairy sources, but when choosing dairy focus on: PLAIN Greek yogurt, cottage cheese, high-protein milk . Fruit o Choose berries  When to Eat . Intermittent Fasting: o Choosing not to eat for a specific time period, but DO FOCUS ON HYDRATION  when fasting o Multiple Techniques: - Time Restricted Eating: eat 3 meals in a day, each meal lasting no more than 60 minutes, no snacks between meals - 16-18 hour fast: fast for 16 to 18 hours up to 7 days a week. Often suggested to start with 2-3 nonconsecutive days per week.  . Remember the time you sleep is counted as fasting.  . Examples of eating schedule: Fast from 7:00pm-11:00am. Eat between 11:00am-7:00pm.  - 24-hour fast: fast for 24 hours up to every other day. Often suggested to start with 1 day per week . Remember the time you sleep is counted as fasting . Examples of eating schedule:  o Eating day: eat 2-3 meals on your eating day. If doing 2 meals, each meal should last no more than 90 minutes. If doing 3 meals, each meal should last no more than 60 minutes. Finish last meal by 7:00pm. o Fasting day: Fast until 7:00pm.  o IF YOU FEEL UNWELL FOR ANY REASON/IN ANY WAY WHEN FASTING, STOP FASTING BY EATING A NUTRITIOUS SNACK OR LIGHT MEAL o ALWAYS FOCUS ON HYDRATION DURING FASTS - Acceptable Hydration sources: water, broths, tea/coffee (black tea/coffee is best but using a small amount of whole-fat dairy products in coffee/tea is acceptable).  - Poor Hydration Sources: anything with sugar or artificial sweeteners added to it  These recommendations have been developed for patients that are actively receiving medical care from either Dr. Sharon Stapel or Sarah Gray, DNP, NP-C at [facility]. These recommendations are developed for patients with specific medical conditions and are not meant to be distributed or used by others that are not actively receiving care from either provider listed   above at [facility]. It is not appropriate to participate in the above eating plans without proper medical supervision.   Reference: Fung, J. The obesity code. [place]: [place]; 2016.   

## 2019-04-21 ENCOUNTER — Encounter (INDEPENDENT_AMBULATORY_CARE_PROVIDER_SITE_OTHER): Payer: BC Managed Care – PPO | Admitting: Internal Medicine

## 2019-09-24 ENCOUNTER — Other Ambulatory Visit: Payer: Self-pay

## 2019-09-24 ENCOUNTER — Telehealth (INDEPENDENT_AMBULATORY_CARE_PROVIDER_SITE_OTHER): Payer: Self-pay

## 2019-09-24 ENCOUNTER — Ambulatory Visit (INDEPENDENT_AMBULATORY_CARE_PROVIDER_SITE_OTHER): Payer: BC Managed Care – PPO

## 2019-09-24 DIAGNOSIS — Z23 Encounter for immunization: Secondary | ICD-10-CM

## 2019-09-24 NOTE — Telephone Encounter (Signed)
Opened in Error.

## 2019-10-19 ENCOUNTER — Encounter (INDEPENDENT_AMBULATORY_CARE_PROVIDER_SITE_OTHER): Payer: BC Managed Care – PPO | Admitting: Nurse Practitioner

## 2019-10-20 ENCOUNTER — Encounter (INDEPENDENT_AMBULATORY_CARE_PROVIDER_SITE_OTHER): Payer: Self-pay | Admitting: Internal Medicine

## 2019-10-20 ENCOUNTER — Other Ambulatory Visit: Payer: Self-pay

## 2019-10-20 ENCOUNTER — Ambulatory Visit (INDEPENDENT_AMBULATORY_CARE_PROVIDER_SITE_OTHER): Payer: BC Managed Care – PPO | Admitting: Internal Medicine

## 2019-10-20 VITALS — BP 136/70 | HR 72 | Temp 97.5°F | Resp 18 | Ht 74.0 in | Wt 241.0 lb

## 2019-10-20 DIAGNOSIS — E669 Obesity, unspecified: Secondary | ICD-10-CM | POA: Diagnosis not present

## 2019-10-20 DIAGNOSIS — Z13220 Encounter for screening for lipoid disorders: Secondary | ICD-10-CM

## 2019-10-20 DIAGNOSIS — Z131 Encounter for screening for diabetes mellitus: Secondary | ICD-10-CM

## 2019-10-20 DIAGNOSIS — Z0000 Encounter for general adult medical examination without abnormal findings: Secondary | ICD-10-CM | POA: Diagnosis not present

## 2019-10-20 DIAGNOSIS — E559 Vitamin D deficiency, unspecified: Secondary | ICD-10-CM

## 2019-10-20 NOTE — Progress Notes (Signed)
Chief Complaint: This 29 year old man comes in for an annual physical exam. HPI: He has recently become a father for the first time in his life with a baby daughter.  He is very happy. He overall is healthy.  He has no significant complaints.  Past Medical History:  Diagnosis Date  . Vitamin D deficiency disease 01/12/2019   History reviewed. No pertinent surgical history.   Social History   Social History Narrative   Engaged,..Lives with fiancee.Works for [employer].    Social History   Tobacco Use  . Smoking status: Never Smoker  . Smokeless tobacco: Never Used  Substance Use Topics  . Alcohol use: No      Allergies: No Known Allergies   No outpatient medications have been marked as taking for the 10/20/19 encounter (Office Visit) with Wilson Singer, MD.       No flowsheet data found.   AYG:EFUWT from the symptoms mentioned above,there are no other symptoms referable to all systems reviewed.       Physical Exam: Blood pressure 136/70, pulse 72, temperature (!) 97.5 F (36.4 C), temperature source Temporal, resp. rate 18, height 6\' 2"  (1.88 m), weight 241 lb (109.3 kg), head circumference 74" (188 cm), SpO2 96 %. Vitals with BMI 10/20/2019 01/12/2019 11/19/2013  Height 6\' 2"  6\' 2"  6\' 3"   Weight 241 lbs 254 lbs 247 lbs  BMI 30.93 32.6 30.9  Systolic 136 130 11/21/2013  Diastolic 70 80 74  Pulse 72 88 64      He looks systemically well.  He is obese but he has lost 13 pounds since the last time I saw him in November. General: Alert, cooperative, and appears to be the stated age.No pallor.  No jaundice.  No clubbing. Head: Normocephalic Eyes: Sclera white, pupils equal and reactive to light, red reflex x 2,  Ears: Normal bilaterally Oral cavity: Lips, mucosa, and tongue normal: Teeth and gums normal Neck: No adenopathy, supple, symmetrical, trachea midline, and thyroid does not appear enlarged Respiratory: Clear to auscultation  bilaterally.No wheezing, crackles or bronchial breathing. Cardiovascular: Heart sounds are present and appear to be normal without murmurs or added sounds.  No carotid bruits.  Peripheral pulses are present and equal bilaterally.: Gastrointestinal:positive bowel sounds, no hepatosplenomegaly.  No masses felt.No tenderness. Skin: Clear, No rashes noted.No worrisome skin lesions seen. Neurological: Grossly intact without focal findings, cranial nerves II through XII intact, muscle strength equal bilaterally Musculoskeletal: No acute joint abnormalities noted.Full range of movement noted with joints. Psychiatric: Affect appropriate, non-anxious.    Assessment  1. Routine general medical examination at a health care facility   2. Obesity (BMI 30.0-34.9)   3. Screening for diabetes mellitus   4. Screening for lipoid disorders   5. Vitamin D deficiency disease     Tests Ordered:   Orders Placed This Encounter  Procedures  . CBC  . COMPLETE METABOLIC PANEL WITH GFR  . Lipid panel  . T3, free  . T4  . TSH  . VITAMIN D 25 Hydroxy (Vit-D Deficiency, Fractures)  . Hemoglobin A1c     Plan  1. Healthy 29 year old man although he is obese. 2. Blood work is ordered. 3. Further recommendations will depend on blood results and I will see him in 6 months time for follow-up.  I did discuss the importance of COVID-19 vaccination and I have recommended that he does get vaccinated.     No orders of the defined types were placed in this encounter.  Eldana Isip C Oluwasemilore Pascuzzi   10/20/2019, 2:41 PM

## 2019-10-21 LAB — COMPLETE METABOLIC PANEL WITH GFR
AG Ratio: 1.9 (calc) (ref 1.0–2.5)
ALT: 19 U/L (ref 9–46)
AST: 15 U/L (ref 10–40)
Albumin: 5 g/dL (ref 3.6–5.1)
Alkaline phosphatase (APISO): 61 U/L (ref 36–130)
BUN: 20 mg/dL (ref 7–25)
CO2: 27 mmol/L (ref 20–32)
Calcium: 9.8 mg/dL (ref 8.6–10.3)
Chloride: 101 mmol/L (ref 98–110)
Creat: 1.17 mg/dL (ref 0.60–1.35)
GFR, Est African American: 98 mL/min/{1.73_m2} (ref >=60)
GFR, Est Non African American: 84 mL/min/{1.73_m2} (ref >=60)
Globulin: 2.7 g/dL (calc) (ref 1.9–3.7)
Glucose, Bld: 91 mg/dL (ref 65–99)
Potassium: 4.5 mmol/L (ref 3.5–5.3)
Sodium: 137 mmol/L (ref 135–146)
Total Bilirubin: 0.3 mg/dL (ref 0.2–1.2)
Total Protein: 7.7 g/dL (ref 6.1–8.1)

## 2019-10-21 LAB — CBC
HCT: 42.5 % (ref 38.5–50.0)
Hemoglobin: 14.5 g/dL (ref 13.2–17.1)
MCH: 29.5 pg (ref 27.0–33.0)
MCHC: 34.1 g/dL (ref 32.0–36.0)
MCV: 86.4 fL (ref 80.0–100.0)
MPV: 10.9 fL (ref 7.5–12.5)
Platelets: 212 10*3/uL (ref 140–400)
RBC: 4.92 10*6/uL (ref 4.20–5.80)
RDW: 12.1 % (ref 11.0–15.0)
WBC: 5.3 10*3/uL (ref 3.8–10.8)

## 2019-10-21 LAB — HEMOGLOBIN A1C
Hgb A1c MFr Bld: 5.1 % of total Hgb (ref <5.7)
Mean Plasma Glucose: 100 (calc)
eAG (mmol/L): 5.5 (calc)

## 2019-10-21 LAB — LIPID PANEL
Cholesterol: 133 mg/dL (ref <200)
HDL: 55 mg/dL (ref >=40)
LDL Cholesterol (Calc): 60 mg/dL (calc)
Non-HDL Cholesterol (Calc): 78 mg/dL (calc) (ref <130)
Total CHOL/HDL Ratio: 2.4 (calc) (ref <5.0)
Triglycerides: 101 mg/dL (ref <150)

## 2019-10-21 LAB — TSH: TSH: 1.41 mIU/L (ref 0.40–4.50)

## 2019-10-21 LAB — T3, FREE: T3, Free: 3.7 pg/mL (ref 2.3–4.2)

## 2019-10-21 LAB — T4: T4, Total: 6.1 ug/dL (ref 4.9–10.5)

## 2019-10-21 LAB — VITAMIN D 25 HYDROXY (VIT D DEFICIENCY, FRACTURES): Vit D, 25-Hydroxy: 18 ng/mL — ABNORMAL LOW (ref 30–100)

## 2019-12-21 ENCOUNTER — Telehealth (INDEPENDENT_AMBULATORY_CARE_PROVIDER_SITE_OTHER): Payer: BC Managed Care – PPO | Admitting: Internal Medicine

## 2019-12-21 ENCOUNTER — Other Ambulatory Visit: Payer: BC Managed Care – PPO

## 2019-12-21 ENCOUNTER — Encounter (INDEPENDENT_AMBULATORY_CARE_PROVIDER_SITE_OTHER): Payer: Self-pay | Admitting: Internal Medicine

## 2019-12-21 DIAGNOSIS — Z20822 Contact with and (suspected) exposure to covid-19: Secondary | ICD-10-CM | POA: Diagnosis not present

## 2019-12-21 DIAGNOSIS — R059 Cough, unspecified: Secondary | ICD-10-CM | POA: Diagnosis not present

## 2019-12-21 MED ORDER — AZITHROMYCIN 250 MG PO TABS: ORAL_TABLET | ORAL | 0 refills | Status: DC

## 2019-12-21 NOTE — Progress Notes (Signed)
Metrics: Intervention Frequency ACO  Documented Smoking Status Yearly  Screened one or more times in 24 months  Cessation Counseling or  Active cessation medication Past 24 months  Past 24 months   Guideline developer: UpToDate (See UpToDate for funding source) Date Released: 2014       Wellness Office Visit  Subjective:  Patient ID: Zachary Strong, male    DOB: 07-24-1990  Age: 29 y.o. MRN: [id number]  CC: This is an audio telemedicine visit with the permission of the patient who is at work and I am in my office.  I was able to identify the patient using 2 identifiers. Productive cough. HPI  This man gives a 1 week history of productive cough of green sputum.  He denies any fevers, body aches nor any wheezing.  To his knowledge, he has not been in contact with anyone who has had COVID-19 disease. Past Medical History:  Diagnosis Date  . Vitamin D deficiency disease 01/12/2019   History reviewed. No pertinent surgical history.   Family History  Problem Relation Age of Onset  . Asthma Mother   . Asthma Brother   . Heart failure Neg Hx     Social History   Social History Narrative   Engaged,..Lives with fiancee.Works for [employer].   Social History   Tobacco Use  . Smoking status: Never Smoker  . Smokeless tobacco: Never Used  Substance Use Topics  . Alcohol use: No    No outpatient medications have been marked as taking for the 12/21/19 encounter (Video Visit) with Wilson Singer, MD.      No flowsheet data found.   Objective:   Today's Vitals: There were no vitals taken for this visit. Vitals with BMI 12/21/2019 10/20/2019 01/12/2019  Height (No Data) 6\' 2"  6\' 2"   Weight (No Data) 241 lbs 254 lbs  BMI - 30.93 32.6  Systolic (No Data) 136 130  Diastolic (No Data) 70 80  Pulse - 72 88     Physical Exam   His voice appears normal on the phone and it does not appear to be in any respiratory distress.  He is alert and  orientated.    Assessment   1. Cough       Tests ordered No orders of the defined types were placed in this encounter.    Plan: 1. I explained to the patient that this could be just a viral illness, atypical infection of the lungs or indeed could be COVID-19 disease.  I am going to empirically treat him with Zithromax but I have strongly urged him to get COVID-19 test.  He said he will do this. 2. If he does not improve, he will let me know.  This visit lasted approximately 5 minutes.   Meds ordered this encounter  Medications  . azithromycin (ZITHROMAX) 250 MG tablet    Sig: Take 2 tablets the first day and then 1 tablet every day for the next 4 days    Dispense:  6 tablet    Refill:  0    Brandilynn Taormina , MD

## 2019-12-22 LAB — SARS-COV-2, NAA 2 DAY TAT

## 2019-12-22 LAB — NOVEL CORONAVIRUS, NAA: SARS-CoV-2, NAA: NOT DETECTED

## 2020-04-21 ENCOUNTER — Ambulatory Visit (INDEPENDENT_AMBULATORY_CARE_PROVIDER_SITE_OTHER): Payer: BC Managed Care – PPO | Admitting: Internal Medicine

## 2020-04-28 ENCOUNTER — Encounter (INDEPENDENT_AMBULATORY_CARE_PROVIDER_SITE_OTHER): Payer: Self-pay | Admitting: Internal Medicine

## 2020-04-28 ENCOUNTER — Ambulatory Visit (INDEPENDENT_AMBULATORY_CARE_PROVIDER_SITE_OTHER): Payer: BC Managed Care – PPO | Admitting: Internal Medicine

## 2020-04-28 ENCOUNTER — Other Ambulatory Visit: Payer: Self-pay

## 2020-04-28 VITALS — BP 110/70 | HR 87 | Temp 97.0°F | Resp 18 | Ht 74.0 in | Wt 247.0 lb

## 2020-04-28 DIAGNOSIS — E559 Vitamin D deficiency, unspecified: Secondary | ICD-10-CM

## 2020-04-28 DIAGNOSIS — E669 Obesity, unspecified: Secondary | ICD-10-CM

## 2020-04-28 DIAGNOSIS — E66811 Obesity, class 1: Secondary | ICD-10-CM

## 2020-04-28 NOTE — Progress Notes (Signed)
Metrics: Intervention Frequency ACO  Documented Smoking Status Yearly  Screened one or more times in 24 months  Cessation Counseling or  Active cessation medication Past 24 months  Past 24 months   Guideline developer: UpToDate (See UpToDate for funding source) Date Released: 2014       Wellness Office Visit  Subjective:  Patient ID: Zachary Strong, male    DOB: 05-Jan-1991  Age: 30 y.o. MRN: [id number]  CC: This man comes in for follow-up of vitamin D deficiency. HPI  He is doing well.  He has been taking vitamin D3 10,000 units daily consistently. Past Medical History:  Diagnosis Date  . Vitamin D deficiency disease 01/12/2019   History reviewed. No pertinent surgical history.   Family History  Problem Relation Age of Onset  . Asthma Mother   . Asthma Brother   . Heart failure Neg Hx     Social History   Social History Narrative   Engaged,..Lives with fiancee.Works for [employer].   Social History   Tobacco Use  . Smoking status: Never Smoker  . Smokeless tobacco: Never Used  Substance Use Topics  . Alcohol use: No    Current Meds  Medication Sig  . Cholecalciferol (VITAMIN D3) 1.25 MG (50000 UT) CAPS Take 2 capsules by mouth daily at 8 pm.       Objective:   Today's Vitals: BP 110/70 (BP Location: Right Arm, Patient Position: Sitting, Cuff Size: Normal)   Pulse 87   Temp (!) 97 F (36.1 C) (Temporal)   Resp 18   Ht 6\' 2"  (1.88 m)   Wt 247 lb (112 kg)   SpO2 98%   BMI 31.71 kg/m  Vitals with BMI 04/28/2020 12/21/2019 10/20/2019  Height 6\' 2"  (No Data) 6\' 2"   Weight 247 lbs (No Data) 241 lbs  BMI 31.7 - 30.93  Systolic 110 (No Data) 136  Diastolic 70 (No Data) 70  Pulse 87 - 72     Physical Exam   He looks systemically well.  Muscular.  Blood pressure excellent.    Assessment   1. Obesity (BMI 30.0-34.9)   2. Vitamin D deficiency disease       Tests ordered Orders Placed This Encounter  Procedures  . VITAMIN  D 25 Hydroxy (Vit-D Deficiency, Fractures)  . COMPLETE METABOLIC PANEL WITH GFR     Plan: 1. Continue with vitamin D3 10,000 units daily and we will check levels today. 2. He will follow up with in 6 months for an annual physical exam.   No orders of the defined types were placed in this encounter.   , MD

## 2020-04-29 LAB — COMPLETE METABOLIC PANEL WITH GFR
AG Ratio: 2 (calc) (ref 1.0–2.5)
ALT: 25 U/L (ref 9–46)
AST: 18 U/L (ref 10–40)
Albumin: 4.8 g/dL (ref 3.6–5.1)
Alkaline phosphatase (APISO): 48 U/L (ref 36–130)
BUN: 23 mg/dL (ref 7–25)
CO2: 29 mmol/L (ref 20–32)
Calcium: 9.8 mg/dL (ref 8.6–10.3)
Chloride: 104 mmol/L (ref 98–110)
Creat: 1.16 mg/dL (ref 0.60–1.35)
GFR, Est African American: 98 mL/min/{1.73_m2} (ref >=60)
GFR, Est Non African American: 85 mL/min/{1.73_m2} (ref >=60)
Globulin: 2.4 g/dL (calc) (ref 1.9–3.7)
Glucose, Bld: 78 mg/dL (ref 65–99)
Potassium: 5 mmol/L (ref 3.5–5.3)
Sodium: 138 mmol/L (ref 135–146)
Total Bilirubin: 0.4 mg/dL (ref 0.2–1.2)
Total Protein: 7.2 g/dL (ref 6.1–8.1)

## 2020-04-29 LAB — VITAMIN D 25 HYDROXY (VIT D DEFICIENCY, FRACTURES): Vit D, 25-Hydroxy: 82 ng/mL (ref 30–100)

## 2020-10-27 ENCOUNTER — Encounter (INDEPENDENT_AMBULATORY_CARE_PROVIDER_SITE_OTHER): Payer: BC Managed Care – PPO | Admitting: Nurse Practitioner

## 2021-11-29 ENCOUNTER — Ambulatory Visit (INDEPENDENT_AMBULATORY_CARE_PROVIDER_SITE_OTHER): Payer: BC Managed Care – PPO | Admitting: Internal Medicine

## 2021-11-29 ENCOUNTER — Encounter: Payer: Self-pay | Admitting: Internal Medicine

## 2021-11-29 VITALS — BP 136/80 | HR 89 | Ht 74.0 in | Wt 282.6 lb

## 2021-11-29 DIAGNOSIS — Z0000 Encounter for general adult medical examination without abnormal findings: Secondary | ICD-10-CM

## 2021-11-29 DIAGNOSIS — E669 Obesity, unspecified: Secondary | ICD-10-CM

## 2021-11-29 DIAGNOSIS — Z114 Encounter for screening for human immunodeficiency virus [HIV]: Secondary | ICD-10-CM | POA: Diagnosis not present

## 2021-11-29 DIAGNOSIS — E66812 Obesity, class 2: Secondary | ICD-10-CM

## 2021-11-29 DIAGNOSIS — Z2821 Immunization not carried out because of patient refusal: Secondary | ICD-10-CM | POA: Diagnosis not present

## 2021-11-29 DIAGNOSIS — Z1159 Encounter for screening for other viral diseases: Secondary | ICD-10-CM | POA: Diagnosis not present

## 2021-11-29 DIAGNOSIS — E559 Vitamin D deficiency, unspecified: Secondary | ICD-10-CM | POA: Diagnosis not present

## 2021-11-29 DIAGNOSIS — Z8639 Personal history of other endocrine, nutritional and metabolic disease: Secondary | ICD-10-CM

## 2021-11-29 DIAGNOSIS — Z131 Encounter for screening for diabetes mellitus: Secondary | ICD-10-CM | POA: Diagnosis not present

## 2021-11-29 DIAGNOSIS — Z13220 Encounter for screening for lipoid disorders: Secondary | ICD-10-CM | POA: Diagnosis not present

## 2021-11-29 DIAGNOSIS — Z0001 Encounter for general adult medical examination with abnormal findings: Secondary | ICD-10-CM

## 2021-11-29 DIAGNOSIS — Z7689 Persons encountering health services in other specified circumstances: Secondary | ICD-10-CM

## 2021-11-29 NOTE — Progress Notes (Signed)
Established Patient Office Visit  Subjective   Patient ID: Zachary Strong, male    DOB: Apr 14, 1990  Age: 31 y.o. MRN: [id number]  Chief Complaint  Patient presents with   Establish Care   Zachary Strong is a 31 year old gentleman presenting to establish care today.  Zachary Strong has a past medical history significant for vitamin D deficiency.  Zachary Strong was previously followed by Dr. Anastasio Champion.  Last seen in February 2022.  There have been no significant interval events.  Zachary Strong reports doing well today.  Zachary Strong has no acute concerns aside from wanting to establish care with a new provider.  Zachary Strong is not currently taking any medications.  Chronic medical conditions and outstanding preventative healthcare maintenance items discussed today are individually addressed in A/P below.  Past Medical History:  Diagnosis Date   Vitamin D deficiency disease 01/12/2019   History reviewed. No pertinent surgical history. Social History   Tobacco Use   Smoking status: Never   Smokeless tobacco: Never  Substance Use Topics   Alcohol use: No   Drug use: No   Family History  Problem Relation Age of Onset   Asthma Mother    Asthma Brother    Heart failure Neg Hx    Allergies  Allergen Reactions   Penicillins     Runs in family history.   Review of Systems  Constitutional:  Negative for chills and fever.  HENT:  Negative for sore throat.   Respiratory:  Negative for cough and shortness of breath.   Cardiovascular:  Negative for chest pain, palpitations and leg swelling.  Gastrointestinal:  Negative for abdominal pain, blood in stool, constipation, diarrhea, nausea and vomiting.  Genitourinary:  Negative for dysuria and hematuria.  Musculoskeletal:  Negative for myalgias.  Skin:  Negative for itching and rash.  Neurological:  Negative for dizziness and headaches.  Psychiatric/Behavioral:  Negative for depression and suicidal ideas.      Objective:     BP 136/80   Pulse 89   Ht 6\' 2"  (1.88 m)   Wt 282 lb 9.6 oz  (128.2 kg)   SpO2 97%   BMI 36.28 kg/m  BP Readings from Last 3 Encounters:  11/29/21 136/80  04/28/20 110/70  10/20/19 136/70   Physical Exam Vitals reviewed.  Constitutional:      General: Zachary Strong is not in acute distress.    Appearance: Normal appearance. Zachary Strong is obese. Zachary Strong is not ill-appearing.  HENT:     Head: Normocephalic and atraumatic.     Nose: Nose normal. No congestion or rhinorrhea.     Mouth/Throat:     Mouth: Mucous membranes are moist.     Pharynx: Oropharynx is clear.  Eyes:     Extraocular Movements: Extraocular movements intact.     Conjunctiva/sclera: Conjunctivae normal.     Pupils: Pupils are equal, round, and reactive to light.  Cardiovascular:     Rate and Rhythm: Normal rate and regular rhythm.     Pulses: Normal pulses.     Heart sounds: Murmur heard.  Pulmonary:     Effort: Pulmonary effort is normal.     Breath sounds: Normal breath sounds. No wheezing, rhonchi or rales.  Abdominal:     General: Abdomen is flat. Bowel sounds are normal. There is no distension.     Palpations: Abdomen is soft.     Tenderness: There is no abdominal tenderness.  Musculoskeletal:        General: No swelling or deformity. Normal range of motion.  Cervical back: Normal range of motion.  Skin:    General: Skin is warm and dry.     Capillary Refill: Capillary refill takes less than 2 seconds.  Neurological:     General: No focal deficit present.     Mental Status: Zachary Strong is alert and oriented to person, place, and time.     Motor: No weakness.  Psychiatric:        Mood and Affect: Mood normal.        Behavior: Behavior normal.        Thought Content: Thought content normal.    Last CBC Lab Results  Component Value Date   WBC 5.3 10/20/2019   HGB 14.5 10/20/2019   HCT 42.5 10/20/2019   MCV 86.4 10/20/2019   MCH 29.5 10/20/2019   RDW 12.1 10/20/2019   PLT 212 [id number]   Last metabolic panel Lab Results  Component Value Date   GLUCOSE 78 04/28/2020   NA 138  04/28/2020   K 5.0 04/28/2020   CL 104 04/28/2020   CO2 29 04/28/2020   BUN 23 04/28/2020   CREATININE 1.16 04/28/2020   GFRNONAA 85 04/28/2020   CALCIUM 9.8 04/28/2020   PROT 7.2 04/28/2020   ALBUMIN 4.5 01/14/2007   BILITOT 0.4 04/28/2020   ALKPHOS 156 01/14/2007   AST 18 04/28/2020   ALT 25 04/28/2020   Last lipids Lab Results  Component Value Date   CHOL 133 10/20/2019   HDL 55 10/20/2019   LDLCALC 60 10/20/2019   TRIG 101 10/20/2019   CHOLHDL 2.4 10/20/2019   Last hemoglobin A1c Lab Results  Component Value Date   HGBA1C 5.1 10/20/2019   Last thyroid functions Lab Results  Component Value Date   TSH 1.41 10/20/2019   T4TOTAL 6.1 10/20/2019   Last vitamin D Lab Results  Component Value Date   VD25OH 82 04/28/2020     Assessment & Plan:   Problem List Items Addressed This Visit     History of vitamin D deficiency    Previously documented history of vitamin D deficiency.  Zachary Strong was treated with vitamin D supplementation and deficiency resolved.  Not currently taking any vitamin D supplementation. -Repeat vitamin D level ordered      Encounter to establish care    Presenting today to establish care.  Medical history reviewed with the patient. -Basic labs ordered today, including one-time HIV/HCV screening -Zachary Strong declined influenza vaccine today -Zachary Strong received 2 Moderna vaccines against COVID-19 but declines additional booster vaccinations      Obesity (BMI 35.0-39.9 without comorbidity)    BMI 36.2.  Zachary Strong is aware of the need to lose weight.  Zachary Strong states that up until the past month Zachary Strong had been working 2 jobs while attempting to balance caring for young child at home.  Zachary Strong recently changed jobs and over the last month has made significant changes to his diet and is incorporating regular exercise into his routine. -Reviewed appropriate diet recommendations, including limiting fatty/fried foods and sweets.  Zachary Strong is trying to incorporate more fruits, nuts, and vegetables  into his diet.  We also discussed the national recommendation for at least 150 total minutes of moderate intensity exercise on a weekly basis. -Lipid panel/HgbA1c ordered today      Return in about 6 months (around 05/30/2022).    Johnette Abraham, MD

## 2021-11-29 NOTE — Assessment & Plan Note (Signed)
Previously documented history of vitamin D deficiency.  He was treated with vitamin D supplementation and deficiency resolved.  Not currently taking any vitamin D supplementation. -Repeat vitamin D level ordered

## 2021-11-29 NOTE — Patient Instructions (Signed)
It was a pleasure to see you today.  Thank you for giving [place] the opportunity to be involved in your care.  Below is a brief recap of your visit and next steps.  We will plan to see you again in 6 months.  Summary We will check labs today  Next steps Follow up in 6 months I will notify you of lab results

## 2021-11-29 NOTE — Assessment & Plan Note (Addendum)
BMI 36.2.  He is aware of the need to lose weight.  He states that up until the past month he had been working 2 jobs while attempting to balance caring for young child at home.  He recently changed jobs and over the last month has made significant changes to his diet and is incorporating regular exercise into his routine. -Reviewed appropriate diet recommendations, including limiting fatty/fried foods and sweets.  He is trying to incorporate more fruits, nuts, and vegetables into his diet.  We also discussed the national recommendation for at least 150 total minutes of moderate intensity exercise on a weekly basis. -Lipid panel/HgbA1c ordered today

## 2021-11-29 NOTE — Assessment & Plan Note (Signed)
Presenting today to establish care.  Medical history reviewed with the patient. -Basic labs ordered today, including one-time HIV/HCV screening -He declined influenza vaccine today -He received 2 Moderna vaccines against COVID-19 but declines additional booster vaccinations

## 2021-11-30 ENCOUNTER — Encounter: Payer: Self-pay | Admitting: Internal Medicine

## 2021-11-30 LAB — CBC WITH DIFFERENTIAL/PLATELET
Basophils Absolute: 0.1 10*3/uL (ref 0.0–0.2)
Basos: 1 %
EOS (ABSOLUTE): 0.2 10*3/uL (ref 0.0–0.4)
Eos: 2 %
Hematocrit: 45.2 % (ref 37.5–51.0)
Hemoglobin: 15 g/dL (ref 13.0–17.7)
Immature Grans (Abs): 0 10*3/uL (ref 0.0–0.1)
Immature Granulocytes: 0 %
Lymphocytes Absolute: 2.1 10*3/uL (ref 0.7–3.1)
Lymphs: 32 %
MCH: 29.4 pg (ref 26.6–33.0)
MCHC: 33.2 g/dL (ref 31.5–35.7)
MCV: 89 fL (ref 79–97)
Monocytes Absolute: 0.6 10*3/uL (ref 0.1–0.9)
Monocytes: 9 %
Neutrophils Absolute: 3.6 10*3/uL (ref 1.4–7.0)
Neutrophils: 56 %
Platelets: 290 10*3/uL (ref 150–450)
RBC: 5.1 x10E6/uL (ref 4.14–5.80)
RDW: 13 % (ref 11.6–15.4)
WBC: 6.6 10*3/uL (ref 3.4–10.8)

## 2021-11-30 LAB — CMP14+EGFR
ALT: 52 IU/L — ABNORMAL HIGH (ref 0–44)
AST: 30 IU/L (ref 0–40)
Albumin/Globulin Ratio: 1.9 (ref 1.2–2.2)
Albumin: 4.4 g/dL (ref 4.1–5.1)
Alkaline Phosphatase: 56 IU/L (ref 44–121)
BUN/Creatinine Ratio: 16 (ref 9–20)
BUN: 16 mg/dL (ref 6–20)
Bilirubin Total: <0.2 mg/dL (ref 0.0–1.2)
CO2: 19 mmol/L — ABNORMAL LOW (ref 20–29)
Calcium: 9.5 mg/dL (ref 8.7–10.2)
Chloride: 101 mmol/L (ref 96–106)
Creatinine, Ser: 0.97 mg/dL (ref 0.76–1.27)
Globulin, Total: 2.3 g/dL (ref 1.5–4.5)
Glucose: 124 mg/dL — ABNORMAL HIGH (ref 70–99)
Potassium: 4.5 mmol/L (ref 3.5–5.2)
Sodium: 136 mmol/L (ref 134–144)
Total Protein: 6.7 g/dL (ref 6.0–8.5)
eGFR: 107 mL/min/{1.73_m2} (ref >59)

## 2021-11-30 LAB — LIPID PANEL
Chol/HDL Ratio: 5 ratio (ref 0.0–5.0)
Cholesterol, Total: 130 mg/dL (ref 100–199)
HDL: 26 mg/dL — ABNORMAL LOW (ref >39)
LDL Chol Calc (NIH): 71 mg/dL (ref 0–99)
Triglycerides: 195 mg/dL — ABNORMAL HIGH (ref 0–149)
VLDL Cholesterol Cal: 33 mg/dL (ref 5–40)

## 2021-11-30 LAB — VITAMIN D 25 HYDROXY (VIT D DEFICIENCY, FRACTURES): Vit D, 25-Hydroxy: 25.1 ng/mL — ABNORMAL LOW (ref 30.0–100.0)

## 2021-11-30 LAB — HCV INTERPRETATION

## 2021-11-30 LAB — HIV ANTIBODY (ROUTINE TESTING W REFLEX): HIV Screen 4th Generation wRfx: NONREACTIVE

## 2021-11-30 LAB — HEMOGLOBIN A1C
Est. average glucose Bld gHb Est-mCnc: 111 mg/dL
Hgb A1c MFr Bld: 5.5 % (ref 4.8–5.6)

## 2021-11-30 LAB — HCV AB W REFLEX TO QUANT PCR: HCV Ab: NONREACTIVE

## 2022-05-30 ENCOUNTER — Encounter: Payer: Self-pay | Admitting: Internal Medicine

## 2022-05-30 ENCOUNTER — Ambulatory Visit (INDEPENDENT_AMBULATORY_CARE_PROVIDER_SITE_OTHER): Payer: BC Managed Care – PPO | Admitting: Internal Medicine

## 2022-05-30 VITALS — BP 122/74 | HR 64 | Ht 74.0 in | Wt 275.4 lb

## 2022-05-30 DIAGNOSIS — R5383 Other fatigue: Secondary | ICD-10-CM | POA: Diagnosis not present

## 2022-05-30 DIAGNOSIS — E559 Vitamin D deficiency, unspecified: Secondary | ICD-10-CM

## 2022-05-30 DIAGNOSIS — R7401 Elevation of levels of liver transaminase levels: Secondary | ICD-10-CM

## 2022-05-30 NOTE — Patient Instructions (Signed)
It was a pleasure to see you today.  Thank you for giving [place] the opportunity to be involved in your care.  Below is a brief recap of your visit and next steps.  We will plan to see you again in 6 months.  Summary Check testosterone, vitamin D, and liver panel one morning later this week Follow up for physical in 6 months

## 2022-05-30 NOTE — Progress Notes (Signed)
Established Patient Office Visit  Subjective   Patient ID: Zachary Strong, male    DOB: 01/31/1991  Age: 32 y.o. MRN: [id number]  Chief Complaint  Patient presents with   Follow-up    6 month   Mr. Zachary Strong returns to care today for 37-month follow-up.  He was last evaluated by me on 11/29/21 at which time no medication changes were made and we reviewed lifestyle modifications aimed at weight loss.  There have been no acute interval events.  Mr. Zachary Strong reports feeling well today.  His acute concern is fatigue, erectile dysfunction, and decreased sex drive.  He has read that the symptoms may be related to hypogonadism and has requested to have his testosterone level assessed.   Past Medical History:  Diagnosis Date   Vitamin D deficiency disease 01/12/2019   History reviewed. No pertinent surgical history. Social History   Tobacco Use   Smoking status: Never   Smokeless tobacco: Never  Substance Use Topics   Alcohol use: No   Drug use: No   Family History  Problem Relation Age of Onset   Asthma Mother    Asthma Brother    Heart failure Neg Hx    Allergies  Allergen Reactions   Penicillins     Runs in family history.   Review of Systems  Constitutional:  Positive for malaise/fatigue.  Genitourinary:        Erectile dysfunction     Objective:     BP 122/74   Pulse 64   Ht 6\' 2"  (1.88 m)   Wt 275 lb 6.4 oz (124.9 kg)   SpO2 95%   BMI 35.36 kg/m  BP Readings from Last 3 Encounters:  05/30/22 122/74  11/29/21 136/80  04/28/20 110/70   Physical Exam Vitals reviewed.  Constitutional:      General: He is not in acute distress.    Appearance: Normal appearance. He is obese. He is not ill-appearing.  HENT:     Head: Normocephalic and atraumatic.     Nose: Nose normal. No congestion or rhinorrhea.     Mouth/Throat:     Mouth: Mucous membranes are moist.     Pharynx: Oropharynx is clear.  Eyes:     Extraocular Movements: Extraocular movements intact.      Conjunctiva/sclera: Conjunctivae normal.     Pupils: Pupils are equal, round, and reactive to light.  Cardiovascular:     Rate and Rhythm: Normal rate and regular rhythm.     Pulses: Normal pulses.     Heart sounds: Murmur heard.  Pulmonary:     Effort: Pulmonary effort is normal.     Breath sounds: Normal breath sounds. No wheezing, rhonchi or rales.  Abdominal:     General: Abdomen is flat. Bowel sounds are normal. There is no distension.     Palpations: Abdomen is soft.     Tenderness: There is no abdominal tenderness.  Musculoskeletal:        General: No swelling or deformity. Normal range of motion.     Cervical back: Normal range of motion.  Skin:    General: Skin is warm and dry.     Capillary Refill: Capillary refill takes less than 2 seconds.  Neurological:     General: No focal deficit present.     Mental Status: He is alert and oriented to person, place, and time.     Motor: No weakness.  Psychiatric:        Mood and Affect: Mood normal.  Behavior: Behavior normal.        Thought Content: Thought content normal.   Last CBC Lab Results  Component Value Date   WBC 6.6 11/29/2021   HGB 15.0 11/29/2021   HCT 45.2 11/29/2021   MCV 89 11/29/2021   MCH 29.4 11/29/2021   RDW 13.0 11/29/2021   PLT 290 [id number]   Last metabolic panel Lab Results  Component Value Date   GLUCOSE 124 (H) 11/29/2021   NA 136 11/29/2021   K 4.5 11/29/2021   CL 101 11/29/2021   CO2 19 (L) 11/29/2021   BUN 16 11/29/2021   CREATININE 0.97 11/29/2021   EGFR 107 11/29/2021   CALCIUM 9.5 11/29/2021   PROT 6.7 11/29/2021   ALBUMIN 4.4 11/29/2021   LABGLOB 2.3 11/29/2021   AGRATIO 1.9 11/29/2021   BILITOT <0.2 11/29/2021   ALKPHOS 56 11/29/2021   AST 30 11/29/2021   ALT 52 (H) 11/29/2021   Last lipids Lab Results  Component Value Date   CHOL 130 11/29/2021   HDL 26 (L) 11/29/2021   LDLCALC 71 11/29/2021   TRIG 195 (H) 11/29/2021   CHOLHDL 5.0 11/29/2021   Last  hemoglobin A1c Lab Results  Component Value Date   HGBA1C 5.5 11/29/2021   Last thyroid functions Lab Results  Component Value Date   TSH 1.41 10/20/2019   T4TOTAL 6.1 10/20/2019   Last vitamin D Lab Results  Component Value Date   VD25OH 25.1 (L) 11/29/2021     Assessment & Plan:   Problem List Items Addressed This Visit       Vitamin D insufficiency    Noted on recent labs.  Daily vitamin D supplementation recommended.      Elevated alanine aminotransferase (ALT) level    Noted on previous labs.  Repeat liver panel ordered today.      Fatigue - Primary    His acute concern today is fatigue, erectile dysfunction, and decreased libido.  He is concerned that he has hypogonadism and requests that his testosterone levels checked. -AM free/total testosterone levels ordered today       Return in about 6 months (around 11/30/2022) for CPE.    Johnette Abraham, MD

## 2022-06-04 ENCOUNTER — Encounter: Payer: Self-pay | Admitting: Internal Medicine

## 2022-06-04 DIAGNOSIS — R5383 Other fatigue: Secondary | ICD-10-CM | POA: Insufficient documentation

## 2022-06-04 DIAGNOSIS — R7401 Elevation of levels of liver transaminase levels: Secondary | ICD-10-CM | POA: Insufficient documentation

## 2022-06-04 NOTE — Assessment & Plan Note (Signed)
Noted on previous labs.  Repeat liver panel ordered today.

## 2022-06-04 NOTE — Assessment & Plan Note (Signed)
Noted on recent labs.  Daily vitamin D supplementation recommended. 

## 2022-06-04 NOTE — Assessment & Plan Note (Signed)
His acute concern today is fatigue, erectile dysfunction, and decreased libido.  He is concerned that he has hypogonadism and requests that his testosterone levels checked. -AM free/total testosterone levels ordered today

## 2022-06-18 DIAGNOSIS — R5383 Other fatigue: Secondary | ICD-10-CM | POA: Diagnosis not present

## 2022-06-18 DIAGNOSIS — R7401 Elevation of levels of liver transaminase levels: Secondary | ICD-10-CM | POA: Diagnosis not present

## 2022-06-18 DIAGNOSIS — E559 Vitamin D deficiency, unspecified: Secondary | ICD-10-CM | POA: Diagnosis not present

## 2022-06-19 ENCOUNTER — Other Ambulatory Visit: Payer: Self-pay | Admitting: Internal Medicine

## 2022-06-19 DIAGNOSIS — E559 Vitamin D deficiency, unspecified: Secondary | ICD-10-CM

## 2022-06-19 DIAGNOSIS — R5383 Other fatigue: Secondary | ICD-10-CM

## 2022-06-19 MED ORDER — VITAMIN D (ERGOCALCIFEROL) 1.25 MG (50000 UNIT) PO CAPS: 50000.0000 [IU] | ORAL_CAPSULE | ORAL | 0 refills | Status: AC

## 2022-06-20 ENCOUNTER — Encounter: Payer: Self-pay | Admitting: Internal Medicine

## 2022-06-22 LAB — TESTOSTERONE,FREE AND TOTAL
Testosterone, Free: 10.8 pg/mL (ref 8.7–25.1)
Testosterone: 259 ng/dL — ABNORMAL LOW (ref 264–916)

## 2022-06-22 LAB — HEPATIC FUNCTION PANEL
ALT: 56 IU/L — ABNORMAL HIGH (ref 0–44)
AST: 26 IU/L (ref 0–40)
Albumin: 4.4 g/dL (ref 4.1–5.1)
Alkaline Phosphatase: 74 IU/L (ref 44–121)
Bilirubin Total: 0.3 mg/dL (ref 0.0–1.2)
Bilirubin, Direct: 0.1 mg/dL (ref 0.00–0.40)
Total Protein: 6.3 g/dL (ref 6.0–8.5)

## 2022-06-22 LAB — VITAMIN D 25 HYDROXY (VIT D DEFICIENCY, FRACTURES): Vit D, 25-Hydroxy: 17.4 ng/mL — ABNORMAL LOW (ref 30.0–100.0)

## 2022-07-18 DIAGNOSIS — R5383 Other fatigue: Secondary | ICD-10-CM | POA: Diagnosis not present

## 2022-07-23 ENCOUNTER — Other Ambulatory Visit: Payer: Self-pay | Admitting: Internal Medicine

## 2022-07-23 ENCOUNTER — Encounter: Payer: Self-pay | Admitting: Internal Medicine

## 2022-07-23 DIAGNOSIS — E291 Testicular hypofunction: Secondary | ICD-10-CM

## 2022-07-24 LAB — TESTOSTERONE,FREE AND TOTAL
Testosterone, Free: 9.7 pg/mL (ref 8.7–25.1)
Testosterone: 278 ng/dL (ref 264–916)

## 2022-07-24 LAB — FSH/LH
FSH: 3 m[IU]/mL (ref 1.5–12.4)
LH: 6 m[IU]/mL (ref 1.7–8.6)

## 2022-08-22 ENCOUNTER — Ambulatory Visit (INDEPENDENT_AMBULATORY_CARE_PROVIDER_SITE_OTHER): Payer: BC Managed Care – PPO | Admitting: Urology

## 2022-08-22 ENCOUNTER — Encounter: Payer: Self-pay | Admitting: Urology

## 2022-08-22 VITALS — BP 127/86 | HR 69

## 2022-08-22 DIAGNOSIS — E291 Testicular hypofunction: Secondary | ICD-10-CM

## 2022-08-22 LAB — URINALYSIS, ROUTINE W REFLEX MICROSCOPIC
Bilirubin, UA: NEGATIVE
Glucose, UA: NEGATIVE
Ketones, UA: NEGATIVE
Leukocytes,UA: NEGATIVE
Nitrite, UA: NEGATIVE
Protein,UA: NEGATIVE
RBC, UA: NEGATIVE
Specific Gravity, UA: <1.005 — ABNORMAL LOW (ref 1.005–1.030)
Urobilinogen, Ur: 0.2 mg/dL (ref 0.2–1.0)
pH, UA: 7 (ref 5.0–7.5)

## 2022-08-22 NOTE — Progress Notes (Signed)
08/22/2022 1:30 PM   KELWIN GIBLER 07-02-1990 [id number]  Referring provider: Billie Lade, MD [address] [place],  [place] [address]  hypogonadism   HPI: Mr Packard is a 32yo her for evaluation of hypogonadism. Starting 6 months ago he developed worsening fatigue, difficulty concentrating and decreased libido. He had 2 testosterone labs which were 259 and 278. No injury to testicles. No significant LUTS. He has issues getting and maintaining an erection for the past 6 months   PMH: Past Medical History:  Diagnosis Date   Vitamin D deficiency disease 01/12/2019    Surgical History: History reviewed. No pertinent surgical history.  Home Medications:  Allergies as of 08/22/2022       Reactions   Penicillins    Runs in family history.        Medication List        Accurate as of August 22, 2022  1:30 PM. If you have any questions, ask your nurse or doctor.          Vitamin D (Ergocalciferol) 1.25 MG (50000 UNIT) Caps capsule Commonly known as: DRISDOL Take 1 capsule (50,000 Units total) by mouth every 7 (seven) days for 12 doses.        Allergies:  Allergies  Allergen Reactions   Penicillins     Runs in family history.    Family History: Family History  Problem Relation Age of Onset   Asthma Mother    Asthma Brother    Heart failure Neg Hx     Social History:  reports that he has never smoked. He has never used smokeless tobacco. He reports that he does not drink alcohol and does not use drugs.  ROS: All other review of systems were reviewed and are negative except what is noted above in HPI  Physical Exam: BP 127/86   Pulse 69   Constitutional:  Alert and oriented, No acute distress. HEENT: [place] AT, moist mucus membranes.  Trachea midline, no masses. Cardiovascular: No clubbing, cyanosis, or edema. Respiratory: Normal respiratory effort, no increased work of breathing. GI: Abdomen is soft, nontender, nondistended, no abdominal  masses GU: No CVA tenderness.  Lymph: No cervical or inguinal lymphadenopathy. Skin: No rashes, bruises or suspicious lesions. Neurologic: Grossly intact, no focal deficits, moving all 4 extremities. Psychiatric: Normal mood and affect.  Laboratory Data: Lab Results  Component Value Date   WBC 6.6 11/29/2021   HGB 15.0 11/29/2021   HCT 45.2 11/29/2021   MCV 89 11/29/2021   PLT 290 11/29/2021    Lab Results  Component Value Date   CREATININE 0.97 11/29/2021    No results found for: "PSA"  Lab Results  Component Value Date   TESTOSTERONE 278 07/18/2022    Lab Results  Component Value Date   HGBA1C 5.5 11/29/2021    Urinalysis    Component Value Date/Time   COLORURINE YELLOW 01/14/2007 0158   APPEARANCEUR CLEAR 01/14/2007 0158   LABSPEC 1.015 01/14/2007 0158   PHURINE 7.0 01/14/2007 0158   GLUCOSEU NEGATIVE 01/14/2007 0158   HGBUR NEGATIVE 01/14/2007 0158   BILIRUBINUR NEGATIVE 01/14/2007 0158   KETONESUR NEGATIVE 01/14/2007 0158   PROTEINUR NEGATIVE 01/14/2007 0158   UROBILINOGEN 0.2 01/14/2007 0158   NITRITE NEGATIVE 01/14/2007 0158   LEUKOCYTESUR  01/14/2007 0158    NEGATIVE MICROSCOPIC NOT DONE ON URINES WITH NEGATIVE PROTEIN, BLOOD, LEUKOCYTES, NITRITE, OR GLUCOSE <1000 mg/dL.    No results found for: "LABMICR", "WBCUA", "RBCUA", "LABEPIT", "MUCUS", "BACTERIA"  Pertinent  Imaging:  No results found for this or any previous visit.  No results found for this or any previous visit.  No results found for this or any previous visit.  No results found for this or any previous visit.  No results found for this or any previous visit.  No valid procedures specified. No results found for this or any previous visit.  No results found for this or any previous visit.   Assessment & Plan:    1. Hypogonadism in male Testosterone labs today, will call with results. Based on labs we will initiate therapy. - Urinalysis, Routine w reflex microscopic   No  follow-ups on file.  Wilkie Aye, MD  [facility] [place]

## 2022-08-23 LAB — COMPREHENSIVE METABOLIC PANEL
ALT: 63 IU/L — ABNORMAL HIGH (ref 0–44)
AST: 30 IU/L (ref 0–40)
Albumin: 4.8 g/dL (ref 4.1–5.1)
Alkaline Phosphatase: 85 IU/L (ref 44–121)
BUN/Creatinine Ratio: 12 (ref 9–20)
BUN: 13 mg/dL (ref 6–20)
Bilirubin Total: 0.3 mg/dL (ref 0.0–1.2)
CO2: 24 mmol/L (ref 20–29)
Calcium: 9.7 mg/dL (ref 8.7–10.2)
Chloride: 101 mmol/L (ref 96–106)
Creatinine, Ser: 1.11 mg/dL (ref 0.76–1.27)
Globulin, Total: 2.3 g/dL (ref 1.5–4.5)
Glucose: 86 mg/dL (ref 70–99)
Potassium: 4.4 mmol/L (ref 3.5–5.2)
Sodium: 139 mmol/L (ref 134–144)
Total Protein: 7.1 g/dL (ref 6.0–8.5)
eGFR: 91 mL/min/{1.73_m2} (ref >59)

## 2022-08-23 LAB — PROLACTIN: Prolactin: 7.1 ng/mL (ref 3.9–22.7)

## 2022-08-23 LAB — ESTRADIOL: Estradiol: 23 pg/mL (ref 7.6–42.6)

## 2022-08-23 LAB — TSH: TSH: 1.12 u[IU]/mL (ref 0.450–4.500)

## 2022-08-23 LAB — CBC
Hematocrit: 46 % (ref 37.5–51.0)
Hemoglobin: 15 g/dL (ref 13.0–17.7)
MCH: 29 pg (ref 26.6–33.0)
MCHC: 32.6 g/dL (ref 31.5–35.7)
MCV: 89 fL (ref 79–97)
Platelets: 233 10*3/uL (ref 150–450)
RBC: 5.17 x10E6/uL (ref 4.14–5.80)
RDW: 12.2 % (ref 11.6–15.4)
WBC: 6.8 10*3/uL (ref 3.4–10.8)

## 2022-08-27 ENCOUNTER — Telehealth: Payer: Self-pay

## 2022-08-27 NOTE — Telephone Encounter (Signed)
Patient called and wanted  to know if you would call him to explain the results he received via MyChart.     Thank you

## 2022-08-28 ENCOUNTER — Encounter: Payer: Self-pay | Admitting: Urology

## 2022-08-30 ENCOUNTER — Encounter: Payer: Self-pay | Admitting: Urology

## 2022-08-30 NOTE — Patient Instructions (Signed)

## 2022-08-31 MED ORDER — CLOMID 50 MG PO TABS: 25.0000 mg | ORAL_TABLET | Freq: Every day | ORAL | 11 refills | Status: DC

## 2022-09-20 ENCOUNTER — Ambulatory Visit: Payer: BC Managed Care – PPO | Admitting: Urology

## 2022-11-15 ENCOUNTER — Other Ambulatory Visit: Payer: BC Managed Care – PPO

## 2022-11-15 DIAGNOSIS — E291 Testicular hypofunction: Secondary | ICD-10-CM

## 2022-11-19 LAB — COMPREHENSIVE METABOLIC PANEL
ALT: 26 IU/L (ref 0–44)
AST: 20 IU/L (ref 0–40)
Albumin: 4.2 g/dL (ref 4.1–5.1)
Alkaline Phosphatase: 64 IU/L (ref 44–121)
BUN/Creatinine Ratio: 11 (ref 9–20)
BUN: 13 mg/dL (ref 6–20)
Bilirubin Total: 0.4 mg/dL (ref 0.0–1.2)
CO2: 22 mmol/L (ref 20–29)
Calcium: 9 mg/dL (ref 8.7–10.2)
Chloride: 102 mmol/L (ref 96–106)
Creatinine, Ser: 1.16 mg/dL (ref 0.76–1.27)
Globulin, Total: 2 g/dL (ref 1.5–4.5)
Glucose: 119 mg/dL — ABNORMAL HIGH (ref 70–99)
Potassium: 4.3 mmol/L (ref 3.5–5.2)
Sodium: 137 mmol/L (ref 134–144)
Total Protein: 6.2 g/dL (ref 6.0–8.5)
eGFR: 86 mL/min/{1.73_m2} (ref >59)

## 2022-11-19 LAB — ESTRADIOL: Estradiol: 31.1 pg/mL (ref 7.6–42.6)

## 2022-11-19 LAB — CBC
Hematocrit: 46.6 % (ref 37.5–51.0)
Hemoglobin: 15.7 g/dL (ref 13.0–17.7)
MCH: 30 pg (ref 26.6–33.0)
MCHC: 33.7 g/dL (ref 31.5–35.7)
MCV: 89 fL (ref 79–97)
Platelets: 252 10*3/uL (ref 150–450)
RBC: 5.24 x10E6/uL (ref 4.14–5.80)
RDW: 12.4 % (ref 11.6–15.4)
WBC: 5.6 10*3/uL (ref 3.4–10.8)

## 2022-11-19 LAB — TESTOSTERONE,FREE AND TOTAL
Testosterone, Free: 12.3 pg/mL (ref 8.7–25.1)
Testosterone: 572 ng/dL (ref 264–916)

## 2022-11-20 ENCOUNTER — Telehealth: Payer: Self-pay

## 2022-11-20 NOTE — Telephone Encounter (Signed)
Patient had to reschedule tomorrow's appointment to next available appointment with Dr. Ronne Binning.  He was wanting to know what Dr. Ronne Binning would suggest until next visit in November?  Please advise.

## 2022-11-21 ENCOUNTER — Ambulatory Visit: Payer: BC Managed Care – PPO | Admitting: Urology

## 2022-11-27 NOTE — Telephone Encounter (Signed)
Return call to patient. Patient states he is still taking his Clomid and needs a refill. Patient is aware a message will be sent to Dr. Ronne Binning on advisement. Patient voiced understanding.

## 2022-11-30 ENCOUNTER — Encounter: Payer: BC Managed Care – PPO | Admitting: Internal Medicine

## 2022-12-18 ENCOUNTER — Other Ambulatory Visit: Payer: Self-pay | Admitting: Urology

## 2022-12-18 MED ORDER — CLOMID 50 MG PO TABS: 25.0000 mg | ORAL_TABLET | Freq: Every day | ORAL | 11 refills | Status: DC

## 2023-01-09 ENCOUNTER — Encounter: Payer: BC Managed Care – PPO | Admitting: Internal Medicine

## 2023-01-18 ENCOUNTER — Ambulatory Visit: Payer: BC Managed Care – PPO | Admitting: Urology

## 2023-02-12 ENCOUNTER — Telehealth: Payer: Self-pay

## 2023-02-12 ENCOUNTER — Telehealth: Payer: Self-pay | Admitting: Urology

## 2023-02-12 NOTE — Telephone Encounter (Signed)
Pt has 30 days of medication left and will need a refill before appt

## 2023-02-12 NOTE — Telephone Encounter (Signed)
Pt was called to inform him that his Rx should have enough refills attached to receive his latest refill request

## 2023-03-13 NOTE — Telephone Encounter (Signed)
 Patient is aware he has a years worth of refills on file. He wanted the rx switched to [facility]. Patient states he was to switch the pharmacy.

## 2023-03-13 NOTE — Telephone Encounter (Signed)
Has this RX been sent?

## 2023-04-17 DIAGNOSIS — K011 Impacted teeth: Secondary | ICD-10-CM | POA: Diagnosis not present

## 2023-04-19 ENCOUNTER — Ambulatory Visit: Payer: BC Managed Care – PPO | Admitting: Urology

## 2023-04-25 ENCOUNTER — Encounter: Payer: Self-pay | Admitting: Urology

## 2023-04-29 MED ORDER — DOXYCYCLINE HYCLATE 100 MG PO CAPS: 100.0000 mg | ORAL_CAPSULE | Freq: Two times a day (BID) | ORAL | 0 refills | Status: DC

## 2023-05-17 DIAGNOSIS — K029 Dental caries, unspecified: Secondary | ICD-10-CM | POA: Diagnosis not present

## 2023-05-17 DIAGNOSIS — K011 Impacted teeth: Secondary | ICD-10-CM | POA: Diagnosis not present

## 2023-05-20 ENCOUNTER — Other Ambulatory Visit

## 2023-05-20 DIAGNOSIS — E291 Testicular hypofunction: Secondary | ICD-10-CM

## 2023-05-25 LAB — COMPREHENSIVE METABOLIC PANEL
ALT: 24 IU/L (ref 0–44)
AST: 21 IU/L (ref 0–40)
Albumin: 4.3 g/dL (ref 4.1–5.1)
Alkaline Phosphatase: 60 IU/L (ref 44–121)
BUN/Creatinine Ratio: 14 (ref 9–20)
BUN: 16 mg/dL (ref 6–20)
Bilirubin Total: 0.3 mg/dL (ref 0.0–1.2)
CO2: 20 mmol/L (ref 20–29)
Calcium: 8.9 mg/dL (ref 8.7–10.2)
Chloride: 106 mmol/L (ref 96–106)
Creatinine, Ser: 1.15 mg/dL (ref 0.76–1.27)
Globulin, Total: 1.8 g/dL (ref 1.5–4.5)
Glucose: 97 mg/dL (ref 70–99)
Potassium: 4.4 mmol/L (ref 3.5–5.2)
Sodium: 142 mmol/L (ref 134–144)
Total Protein: 6.1 g/dL (ref 6.0–8.5)
eGFR: 87 mL/min/{1.73_m2} (ref >59)

## 2023-05-25 LAB — CBC WITH DIFFERENTIAL/PLATELET
Basophils Absolute: 0 10*3/uL (ref 0.0–0.2)
Basos: 0 %
EOS (ABSOLUTE): 0 10*3/uL (ref 0.0–0.4)
Eos: 0 %
Hematocrit: 43.3 % (ref 37.5–51.0)
Hemoglobin: 14.6 g/dL (ref 13.0–17.7)
Immature Grans (Abs): 0.1 10*3/uL (ref 0.0–0.1)
Immature Granulocytes: 1 %
Lymphocytes Absolute: 1.7 10*3/uL (ref 0.7–3.1)
Lymphs: 19 %
MCH: 30.4 pg (ref 26.6–33.0)
MCHC: 33.7 g/dL (ref 31.5–35.7)
MCV: 90 fL (ref 79–97)
Monocytes Absolute: 0.7 10*3/uL (ref 0.1–0.9)
Monocytes: 8 %
Neutrophils Absolute: 6.4 10*3/uL (ref 1.4–7.0)
Neutrophils: 72 %
Platelets: 208 10*3/uL (ref 150–450)
RBC: 4.81 x10E6/uL (ref 4.14–5.80)
RDW: 12.8 % (ref 11.6–15.4)
WBC: 8.9 10*3/uL (ref 3.4–10.8)

## 2023-05-25 LAB — ESTRADIOL: Estradiol: 23.1 pg/mL (ref 7.6–42.6)

## 2023-05-25 LAB — TESTOSTERONE,FREE AND TOTAL
Testosterone, Free: 9 pg/mL (ref 8.7–25.1)
Testosterone: 427 ng/dL (ref 264–916)

## 2023-06-14 ENCOUNTER — Ambulatory Visit (INDEPENDENT_AMBULATORY_CARE_PROVIDER_SITE_OTHER): Payer: BC Managed Care – PPO | Admitting: Urology

## 2023-06-14 VITALS — BP 127/70 | HR 85

## 2023-06-14 DIAGNOSIS — E291 Testicular hypofunction: Secondary | ICD-10-CM

## 2023-06-14 MED ORDER — CLOMIPHENE CITRATE 50 MG PO TABS: 25.0000 mg | ORAL_TABLET | Freq: Every day | ORAL | 11 refills | Status: AC

## 2023-06-14 MED ORDER — DOXYCYCLINE HYCLATE 50 MG PO CAPS: 50.0000 mg | ORAL_CAPSULE | Freq: Every day | ORAL | 3 refills | Status: AC

## 2023-06-14 NOTE — Progress Notes (Signed)
 06/14/2023 12:22 PM   Telford Feather 01/04/1991 [id number]  Referring provider: Tobi Fortes, MD [address] [place],  [place] [address]  Followup hypogonadism  HPI: Mr Zachary Strong is a 33yo here for followup for hypogonadism. Testosterone  427, hemoglobin 14.6, estradiol  23. Energy is good. He is having acne which improved with doxycycline . Energy is good. Good libido    PMH: Past Medical History:  Diagnosis Date   Vitamin D  deficiency disease 01/12/2019    Surgical History: No past surgical history on file.  Home Medications:  Allergies as of 06/14/2023       Reactions   Penicillins    Runs in family history.        Medication List        Accurate as of June 14, 2023 12:22 PM. If you have any questions, ask your nurse or doctor.          Clomid  50 MG tablet Generic drug: clomiPHENE  Take 0.5 tablets (25 mg total) by mouth daily.   doxycycline  100 MG capsule Commonly known as: VIBRAMYCIN  Take 1 capsule (100 mg total) by mouth every 12 (twelve) hours.        Allergies:  Allergies  Allergen Reactions   Penicillins     Runs in family history.    Family History: Family History  Problem Relation Age of Onset   Asthma Mother    Asthma Brother    Heart failure Neg Hx     Social History:  reports that he has never smoked. He has never used smokeless tobacco. He reports that he does not drink alcohol and does not use drugs.  ROS: All other review of systems were reviewed and are negative except what is noted above in HPI  Physical Exam: BP 127/70   Pulse 85   Constitutional:  Alert and oriented, No acute distress. HEENT: [place] AT, moist mucus membranes.  Trachea midline, no masses. Cardiovascular: No clubbing, cyanosis, or edema. Respiratory: Normal respiratory effort, no increased work of breathing. GI: Abdomen is soft, nontender, nondistended, no abdominal masses GU: No CVA tenderness.  Lymph: No cervical or inguinal lymphadenopathy. Skin:  No rashes, bruises or suspicious lesions. Neurologic: Grossly intact, no focal deficits, moving all 4 extremities. Psychiatric: Normal mood and affect.  Laboratory Data: Lab Results  Component Value Date   WBC 8.9 05/20/2023   HGB 14.6 05/20/2023   HCT 43.3 05/20/2023   MCV 90 05/20/2023   PLT 208 05/20/2023    Lab Results  Component Value Date   CREATININE 1.15 05/20/2023    No results found for: "PSA"  Lab Results  Component Value Date   TESTOSTERONE  427 05/20/2023    Lab Results  Component Value Date   HGBA1C 5.5 11/29/2021    Urinalysis    Component Value Date/Time   COLORURINE YELLOW 01/14/2007 0158   APPEARANCEUR Clear 08/22/2022 1322   LABSPEC 1.015 01/14/2007 0158   PHURINE 7.0 01/14/2007 0158   GLUCOSEU Negative 08/22/2022 1322   HGBUR NEGATIVE 01/14/2007 0158   BILIRUBINUR Negative 08/22/2022 1322   KETONESUR NEGATIVE 01/14/2007 0158   PROTEINUR Negative 08/22/2022 1322   PROTEINUR NEGATIVE 01/14/2007 0158   UROBILINOGEN 0.2 01/14/2007 0158   NITRITE Negative 08/22/2022 1322   NITRITE NEGATIVE 01/14/2007 0158   LEUKOCYTESUR Negative 08/22/2022 1322    Lab Results  Component Value Date   LABMICR Comment 08/22/2022    Pertinent Imaging:  No results found for this or any previous visit.  No results found  for this or any previous visit.  No results found for this or any previous visit.  No results found for this or any previous visit.  No results found for this or any previous visit.  No results found for this or any previous visit.  No results found for this or any previous visit.  No results found for this or any previous visit.   Assessment & Plan:    1. Hypogonadism in male (Primary) Continue clomid  25mg  daily Followup 6 months with testosterone  labs   No follow-ups on file.  Johnie Nailer, MD  [facility] [place]

## 2023-06-25 ENCOUNTER — Encounter: Payer: Self-pay | Admitting: Urology

## 2023-06-25 NOTE — Patient Instructions (Signed)

## 2023-12-13 ENCOUNTER — Other Ambulatory Visit

## 2023-12-20 ENCOUNTER — Telehealth: Payer: Self-pay

## 2023-12-20 ENCOUNTER — Encounter (INDEPENDENT_AMBULATORY_CARE_PROVIDER_SITE_OTHER): Admitting: Urology

## 2023-12-20 VITALS — BP 125/84 | HR 73

## 2023-12-20 DIAGNOSIS — E291 Testicular hypofunction: Secondary | ICD-10-CM

## 2023-12-20 MED ORDER — TESTOSTERONE CYPIONATE 200 MG/ML IM SOLN: 100.0000 mg | INTRAMUSCULAR | 2 refills | Status: AC

## 2023-12-20 NOTE — Addendum Note (Signed)
 Addended by: MALACHY SLICE on: 12/20/2023 12:07 PM   Modules accepted: Orders

## 2023-12-20 NOTE — Telephone Encounter (Signed)
 Medication prior authorization request received.  Completed PA request through cover my meds for drug testosterone . KEY:  [id number]  Approved: Yes

## 2023-12-23 DIAGNOSIS — E291 Testicular hypofunction: Secondary | ICD-10-CM | POA: Diagnosis not present

## 2023-12-24 LAB — CBC
Hematocrit: 44.9 % (ref 37.5–51.0)
Hemoglobin: 14.9 g/dL (ref 13.0–17.7)
MCH: 30.3 pg (ref 26.6–33.0)
MCHC: 33.2 g/dL (ref 31.5–35.7)
MCV: 91 fL (ref 79–97)
Platelets: 202 x10E3/uL (ref 150–450)
RBC: 4.92 x10E6/uL (ref 4.14–5.80)
RDW: 12.7 % (ref 11.6–15.4)
WBC: 7.2 x10E3/uL (ref 3.4–10.8)

## 2023-12-24 LAB — COMPREHENSIVE METABOLIC PANEL WITH GFR
ALT: 24 IU/L (ref 0–44)
AST: 22 IU/L (ref 0–40)
Albumin: 4.2 g/dL (ref 4.1–5.1)
Alkaline Phosphatase: 56 IU/L (ref 47–123)
BUN/Creatinine Ratio: 17 (ref 9–20)
BUN: 19 mg/dL (ref 6–20)
Bilirubin Total: 0.3 mg/dL (ref 0.0–1.2)
CO2: 23 mmol/L (ref 20–29)
Calcium: 8.9 mg/dL (ref 8.7–10.2)
Chloride: 103 mmol/L (ref 96–106)
Creatinine, Ser: 1.15 mg/dL (ref 0.76–1.27)
Globulin, Total: 2 g/dL (ref 1.5–4.5)
Glucose: 89 mg/dL (ref 70–99)
Potassium: 4.5 mmol/L (ref 3.5–5.2)
Sodium: 138 mmol/L (ref 134–144)
Total Protein: 6.2 g/dL (ref 6.0–8.5)
eGFR: 86 mL/min/1.73 (ref >59)

## 2023-12-24 LAB — TESTOSTERONE,FREE AND TOTAL
Testosterone, Free: 22.4 pg/mL (ref 8.7–25.1)
Testosterone: 600 ng/dL (ref 264–916)

## 2023-12-24 LAB — ESTRADIOL: Estradiol: 30.1 pg/mL (ref 7.6–42.6)

## 2023-12-24 NOTE — Progress Notes (Signed)
 Patient rescheduled.

## 2024-02-12 DIAGNOSIS — L719 Rosacea, unspecified: Secondary | ICD-10-CM | POA: Diagnosis not present
